# Patient Record
Sex: Female | Born: 1935 | Race: White | Hispanic: No | Smoking: Former smoker
Health system: Southern US, Community
[De-identification: ages and names within clinical notes are randomized; demographics above are authoritative.]

## PROBLEM LIST (undated history)

## (undated) DIAGNOSIS — J449 Chronic obstructive pulmonary disease, unspecified: Secondary | ICD-10-CM

## (undated) DIAGNOSIS — M1990 Unspecified osteoarthritis, unspecified site: Secondary | ICD-10-CM

## (undated) DIAGNOSIS — Z8619 Personal history of other infectious and parasitic diseases: Secondary | ICD-10-CM

## (undated) DIAGNOSIS — C069 Malignant neoplasm of mouth, unspecified: Secondary | ICD-10-CM

## (undated) DIAGNOSIS — D649 Anemia, unspecified: Secondary | ICD-10-CM

## (undated) DIAGNOSIS — G459 Transient cerebral ischemic attack, unspecified: Secondary | ICD-10-CM

## (undated) DIAGNOSIS — I639 Cerebral infarction, unspecified: Secondary | ICD-10-CM

## (undated) DIAGNOSIS — I10 Essential (primary) hypertension: Secondary | ICD-10-CM

## (undated) DIAGNOSIS — G5600 Carpal tunnel syndrome, unspecified upper limb: Secondary | ICD-10-CM

## (undated) DIAGNOSIS — E119 Type 2 diabetes mellitus without complications: Secondary | ICD-10-CM

## (undated) DIAGNOSIS — C3490 Malignant neoplasm of unspecified part of unspecified bronchus or lung: Secondary | ICD-10-CM

## (undated) DIAGNOSIS — J45909 Unspecified asthma, uncomplicated: Secondary | ICD-10-CM

## (undated) DIAGNOSIS — H349 Unspecified retinal vascular occlusion: Secondary | ICD-10-CM

## (undated) DIAGNOSIS — I2510 Atherosclerotic heart disease of native coronary artery without angina pectoris: Secondary | ICD-10-CM

## (undated) DIAGNOSIS — I509 Heart failure, unspecified: Secondary | ICD-10-CM

## (undated) HISTORY — PX: CATARACT EXTRACTION: SUR2

## (undated) HISTORY — PX: ABDOMINAL HYSTERECTOMY: SHX81

## (undated) HISTORY — PX: MOUTH SURGERY: SHX715

## (undated) HISTORY — PX: CHOLECYSTECTOMY: SHX55

## (undated) HISTORY — PX: APPENDECTOMY: SHX54

---
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
## 2005-01-20 ENCOUNTER — Ambulatory Visit: Payer: Self-pay | Admitting: Unknown Physician Specialty

## 2005-02-02 ENCOUNTER — Emergency Department: Payer: Self-pay | Admitting: General Practice

## 2005-05-18 ENCOUNTER — Ambulatory Visit: Payer: Self-pay

## 2005-06-29 ENCOUNTER — Ambulatory Visit: Payer: Self-pay | Admitting: Family Medicine

## 2005-07-27 ENCOUNTER — Ambulatory Visit: Payer: Self-pay | Admitting: Rheumatology

## 2005-08-02 ENCOUNTER — Emergency Department: Payer: Self-pay | Admitting: Emergency Medicine

## 2005-08-02 ENCOUNTER — Other Ambulatory Visit: Payer: Self-pay

## 2005-08-15 ENCOUNTER — Encounter: Payer: Self-pay | Admitting: Rheumatology

## 2005-09-04 ENCOUNTER — Encounter: Payer: Self-pay | Admitting: Rheumatology

## 2005-10-05 ENCOUNTER — Encounter: Payer: Self-pay | Admitting: Rheumatology

## 2006-07-12 ENCOUNTER — Other Ambulatory Visit: Payer: Self-pay

## 2006-07-12 ENCOUNTER — Ambulatory Visit: Payer: Self-pay | Admitting: Ophthalmology

## 2006-07-17 ENCOUNTER — Ambulatory Visit: Payer: Self-pay | Admitting: Ophthalmology

## 2006-07-21 ENCOUNTER — Ambulatory Visit: Payer: Self-pay | Admitting: Unknown Physician Specialty

## 2006-09-05 ENCOUNTER — Ambulatory Visit: Payer: Self-pay | Admitting: Ophthalmology

## 2006-09-11 ENCOUNTER — Ambulatory Visit: Payer: Self-pay | Admitting: Ophthalmology

## 2007-05-09 ENCOUNTER — Ambulatory Visit: Payer: Self-pay | Admitting: Unknown Physician Specialty

## 2007-05-14 ENCOUNTER — Ambulatory Visit: Payer: Self-pay | Admitting: Unknown Physician Specialty

## 2007-08-28 ENCOUNTER — Ambulatory Visit: Payer: Self-pay | Admitting: Urology

## 2007-10-11 ENCOUNTER — Ambulatory Visit: Payer: Self-pay | Admitting: Unknown Physician Specialty

## 2007-11-20 ENCOUNTER — Ambulatory Visit: Payer: Self-pay | Admitting: Family Medicine

## 2008-05-13 ENCOUNTER — Ambulatory Visit: Payer: Self-pay | Admitting: Urology

## 2008-12-05 ENCOUNTER — Ambulatory Visit: Payer: Self-pay | Admitting: Oncology

## 2008-12-05 HISTORY — PX: LOBECTOMY: SHX5089

## 2008-12-10 ENCOUNTER — Ambulatory Visit: Payer: Self-pay | Admitting: Unknown Physician Specialty

## 2008-12-15 ENCOUNTER — Ambulatory Visit: Payer: Self-pay | Admitting: Specialist

## 2008-12-22 ENCOUNTER — Ambulatory Visit: Payer: Self-pay | Admitting: Specialist

## 2008-12-24 ENCOUNTER — Ambulatory Visit: Payer: Self-pay | Admitting: Oncology

## 2009-01-05 ENCOUNTER — Ambulatory Visit: Payer: Self-pay | Admitting: Oncology

## 2009-01-06 ENCOUNTER — Ambulatory Visit: Payer: Self-pay | Admitting: Surgery

## 2009-01-14 ENCOUNTER — Inpatient Hospital Stay: Payer: Self-pay | Admitting: Surgery

## 2009-01-23 ENCOUNTER — Encounter: Payer: Self-pay | Admitting: Internal Medicine

## 2009-01-31 ENCOUNTER — Inpatient Hospital Stay: Payer: Self-pay | Admitting: Surgery

## 2009-02-02 ENCOUNTER — Ambulatory Visit: Payer: Self-pay | Admitting: Oncology

## 2009-02-07 ENCOUNTER — Encounter: Payer: Self-pay | Admitting: Internal Medicine

## 2009-02-16 ENCOUNTER — Ambulatory Visit: Payer: Self-pay | Admitting: Surgery

## 2009-02-23 ENCOUNTER — Ambulatory Visit: Payer: Self-pay | Admitting: Oncology

## 2009-02-25 ENCOUNTER — Emergency Department: Payer: Self-pay | Admitting: Emergency Medicine

## 2009-03-05 ENCOUNTER — Ambulatory Visit: Payer: Self-pay | Admitting: Oncology

## 2009-03-11 ENCOUNTER — Ambulatory Visit: Payer: Self-pay | Admitting: Surgery

## 2009-03-12 IMAGING — CR DG CHEST 2V
1 series · 2 of 2 positions shown · non-contrast
Comparison: none

REASON FOR EXAM: f/u thoracotomy
COMMENTS:

[Series 1: view not recorded · 0.17mm/px · 2 of 2 slices shown]
[im 1/2]
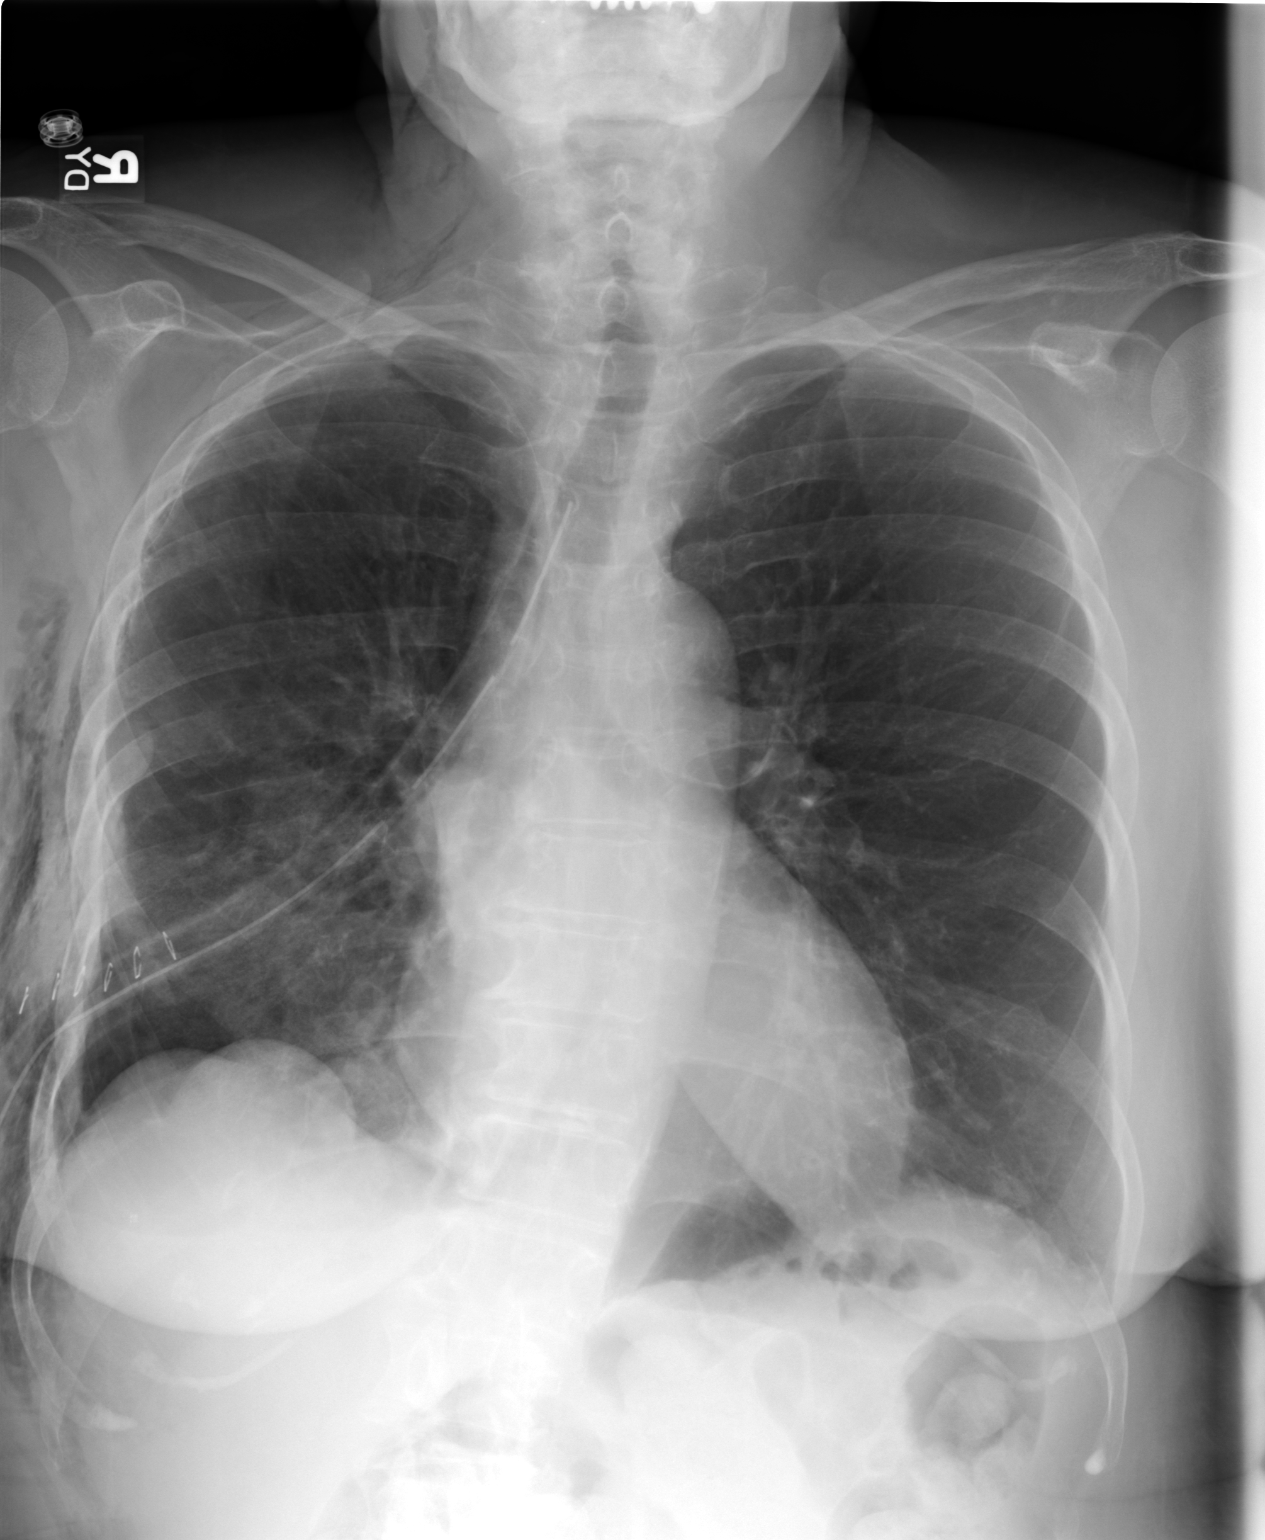
[im 2/2]
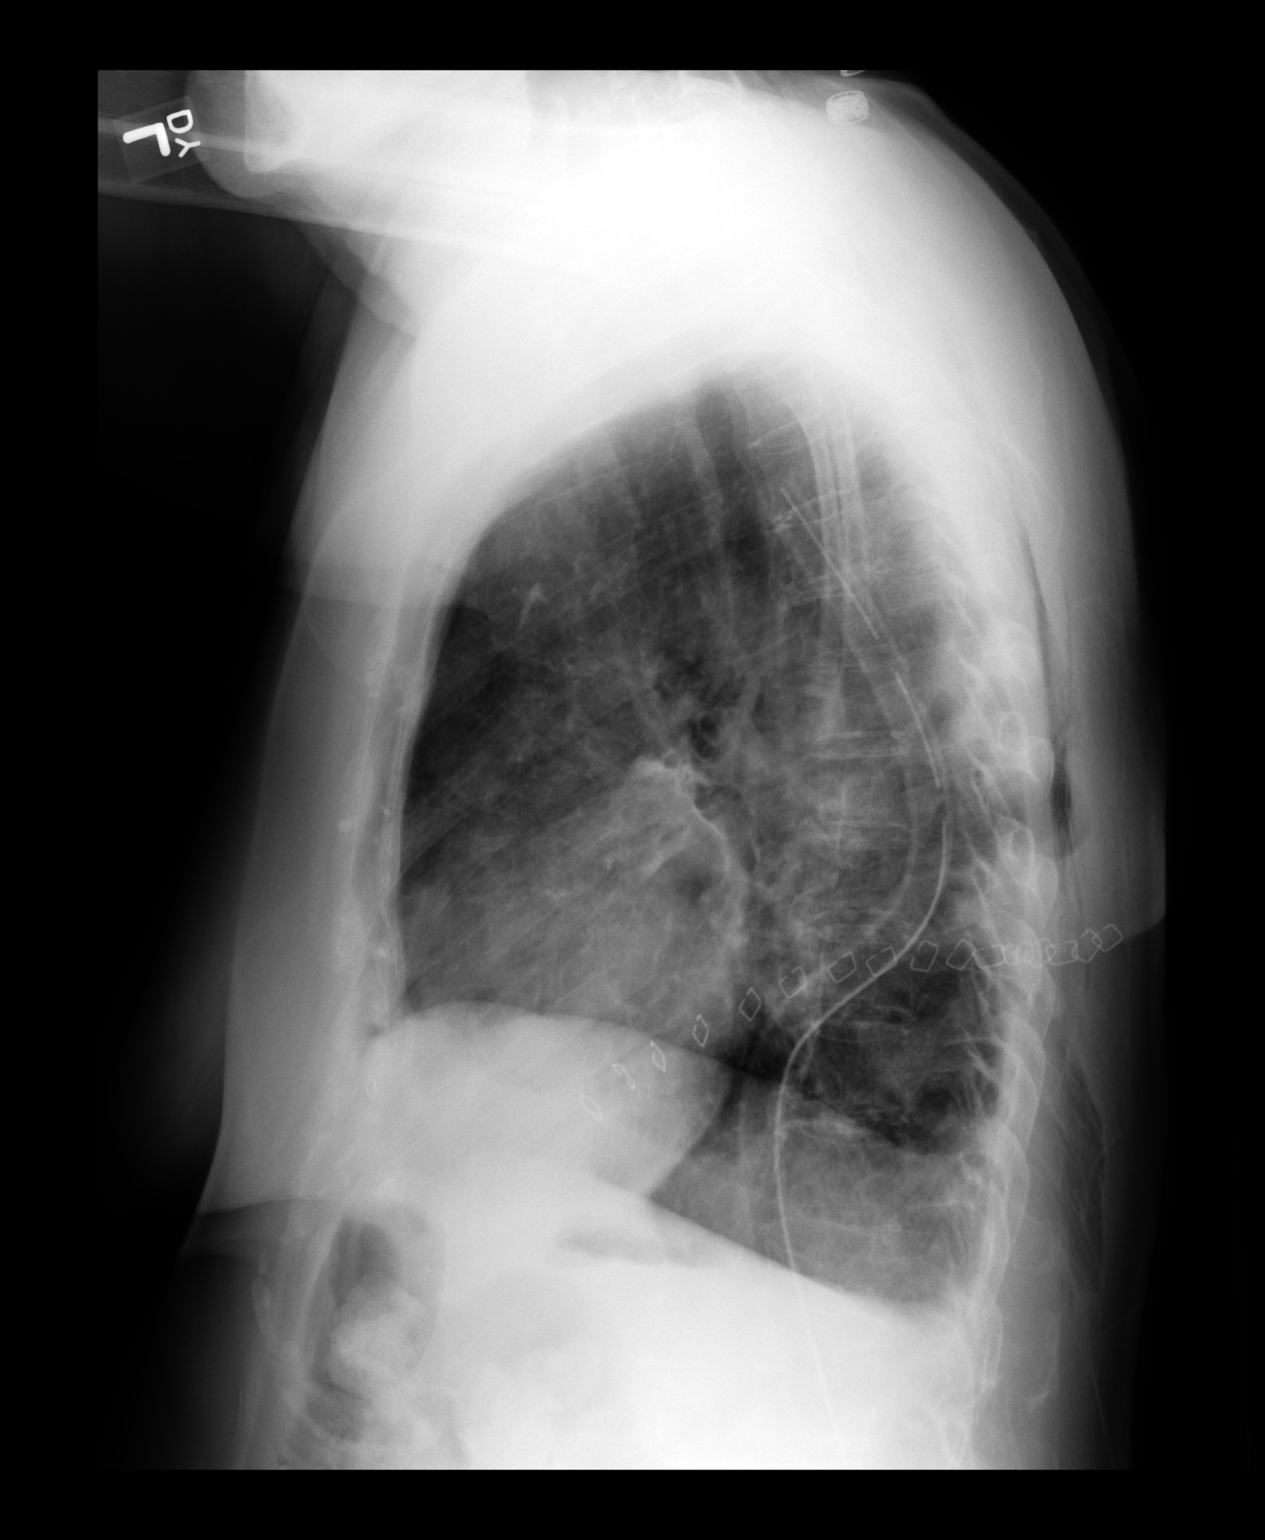

[2 of 2 positions shown; findings below may reference images not displayed]

PROCEDURE:     DXR - DXR CHEST PA (OR AP) AND LATERAL  - January 19, 2009 [DATE]

RESULT:     Comparison is made to the exam of 01/17/2009.

Right sided chest tube remains in place with the tip along the upper
mediastinal region. There is a small apical pneumothorax on the right which
is unchanged. There is a moderately large amount of subcutaneous emphysema.
There is lucency at the right costophrenic angle which could represent a
small pneumothorax in that region. The heart is not enlarged. The lungs are
otherwise relatively clear with improved aeration in the right lung base.
IMPRESSION: Single chest tube remains. Persistent pneumothorax.
Recurrent subcutaneous emphysema.

## 2009-03-14 IMAGING — CR DG CHEST 1V PORT
1 series · 1 of 1 positions shown · non-contrast
Comparison: none

REASON FOR EXAM: Thoracotomy
COMMENTS:

PROCEDURE:     DXR - DXR PORTABLE CHEST SINGLE VIEW  - January 21, 2009  [DATE]
RESULT:     Comparison: 01/19/2009

[view not recorded]
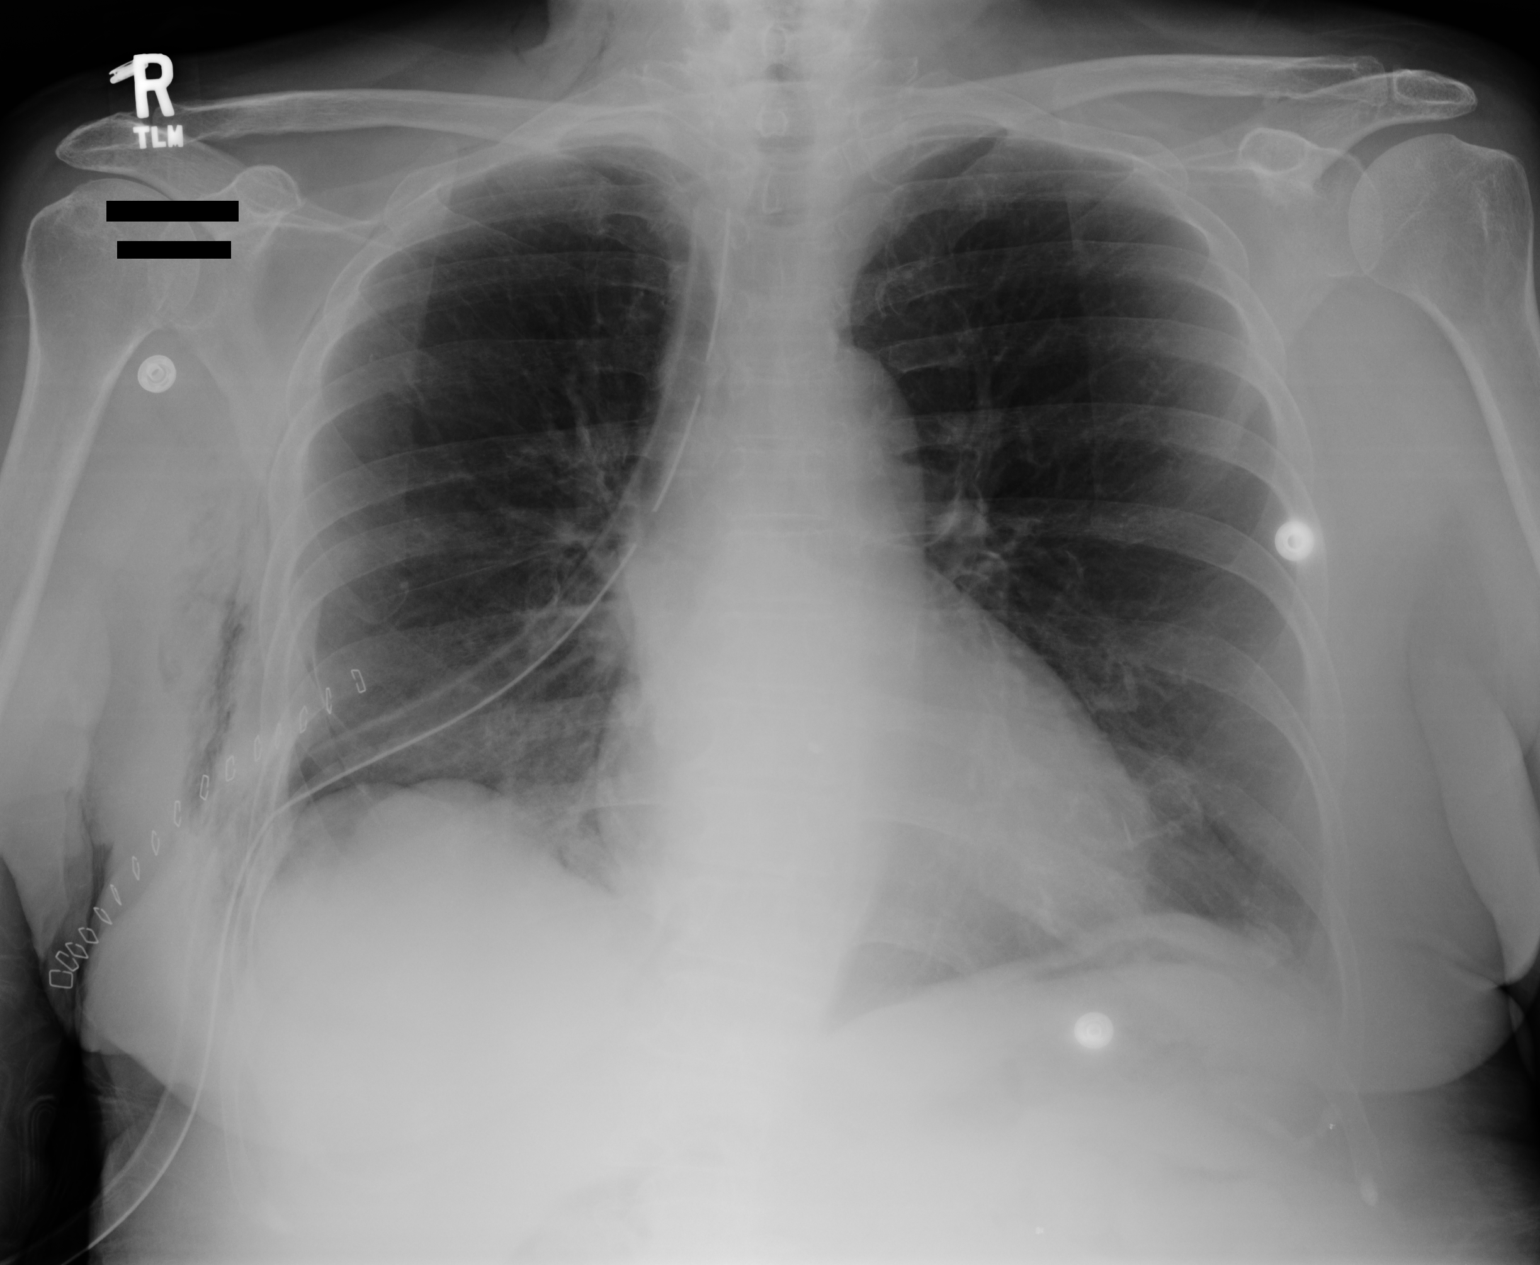

[1 of 1 positions shown; findings below may reference images not displayed]

FINDINGS: Single portable AP chest radiograph is provided. Again demonstrated is a
right-sided chest tube with the tip in unchanged position. There is no
significant pneumothorax visualized. There is subcutaneous emphysema in the
right lateral chest wall which is decreased from the prior examination.
There is lucency at the right costophrenic angle which may represent a small
pneumothorax versus superimposed cutaneous emphysema. The heart and
mediastinum are stable.
IMPRESSION: Please see above.

## 2009-03-15 IMAGING — CR DG CHEST 1V PORT
1 series · 1 of 1 positions shown · non-contrast
Comparison: none

REASON FOR EXAM: Thoracotomy
COMMENTS:

[view not recorded]
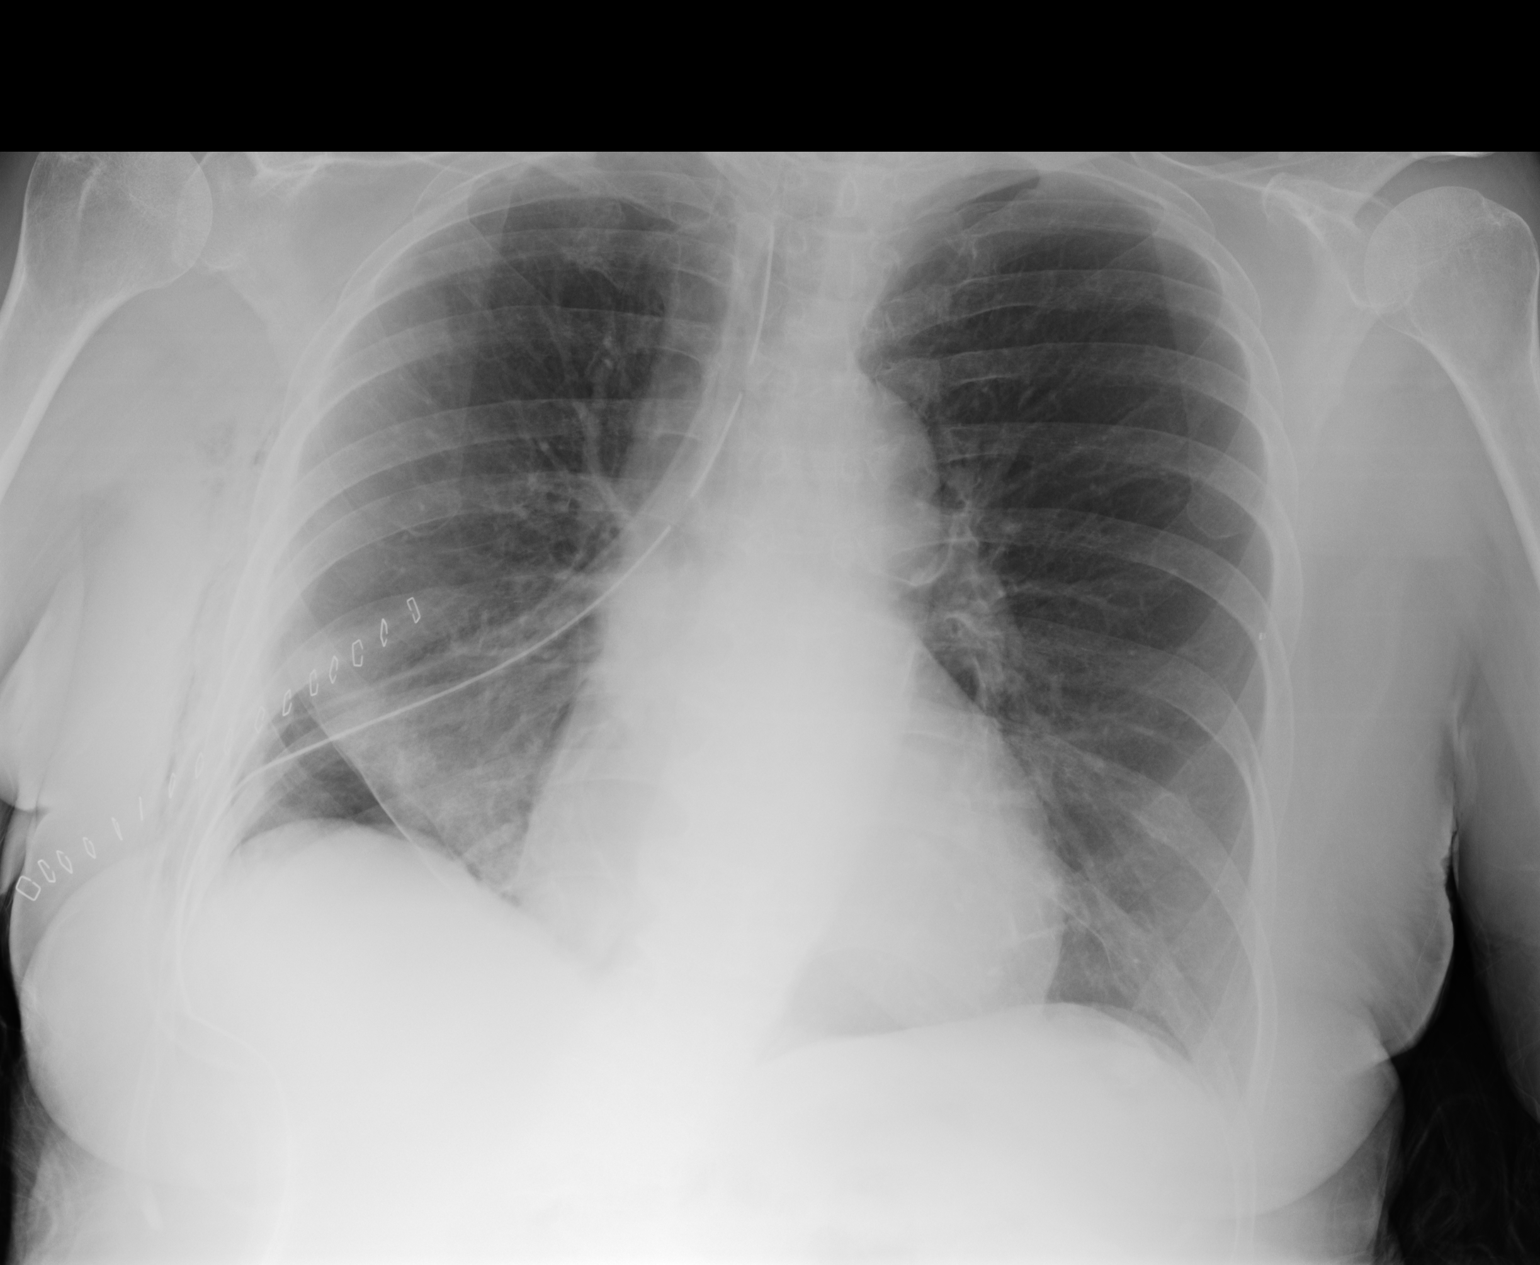

[1 of 1 positions shown; findings below may reference images not displayed]

PROCEDURE:     DXR - DXR PORTABLE CHEST SINGLE VIEW  - January 22, 2009  [DATE]

RESULT:     Thoracotomy is noted on the right. A chest tube is noted on the
right. A small, right pneumothorax is present. Subcutaneous emphysema is
noted in the right chest. The left lung is clear. The cardiovascular
structures are unremarkable.
IMPRESSION: Chest tube noted in stable position with a small, right base
pneumothorax that is stable. The patient has had a right thoracotomy. No
acute changes are noted from 01/21/2009.

## 2009-03-24 IMAGING — CR DG CHEST 2V
1 series · 2 of 2 positions shown · non-contrast
Comparison: none

REASON FOR EXAM: pneumothorax
COMMENTS:

[Series 1: view not recorded · 0.17mm/px · 2 of 2 slices shown]
[im 1/2]
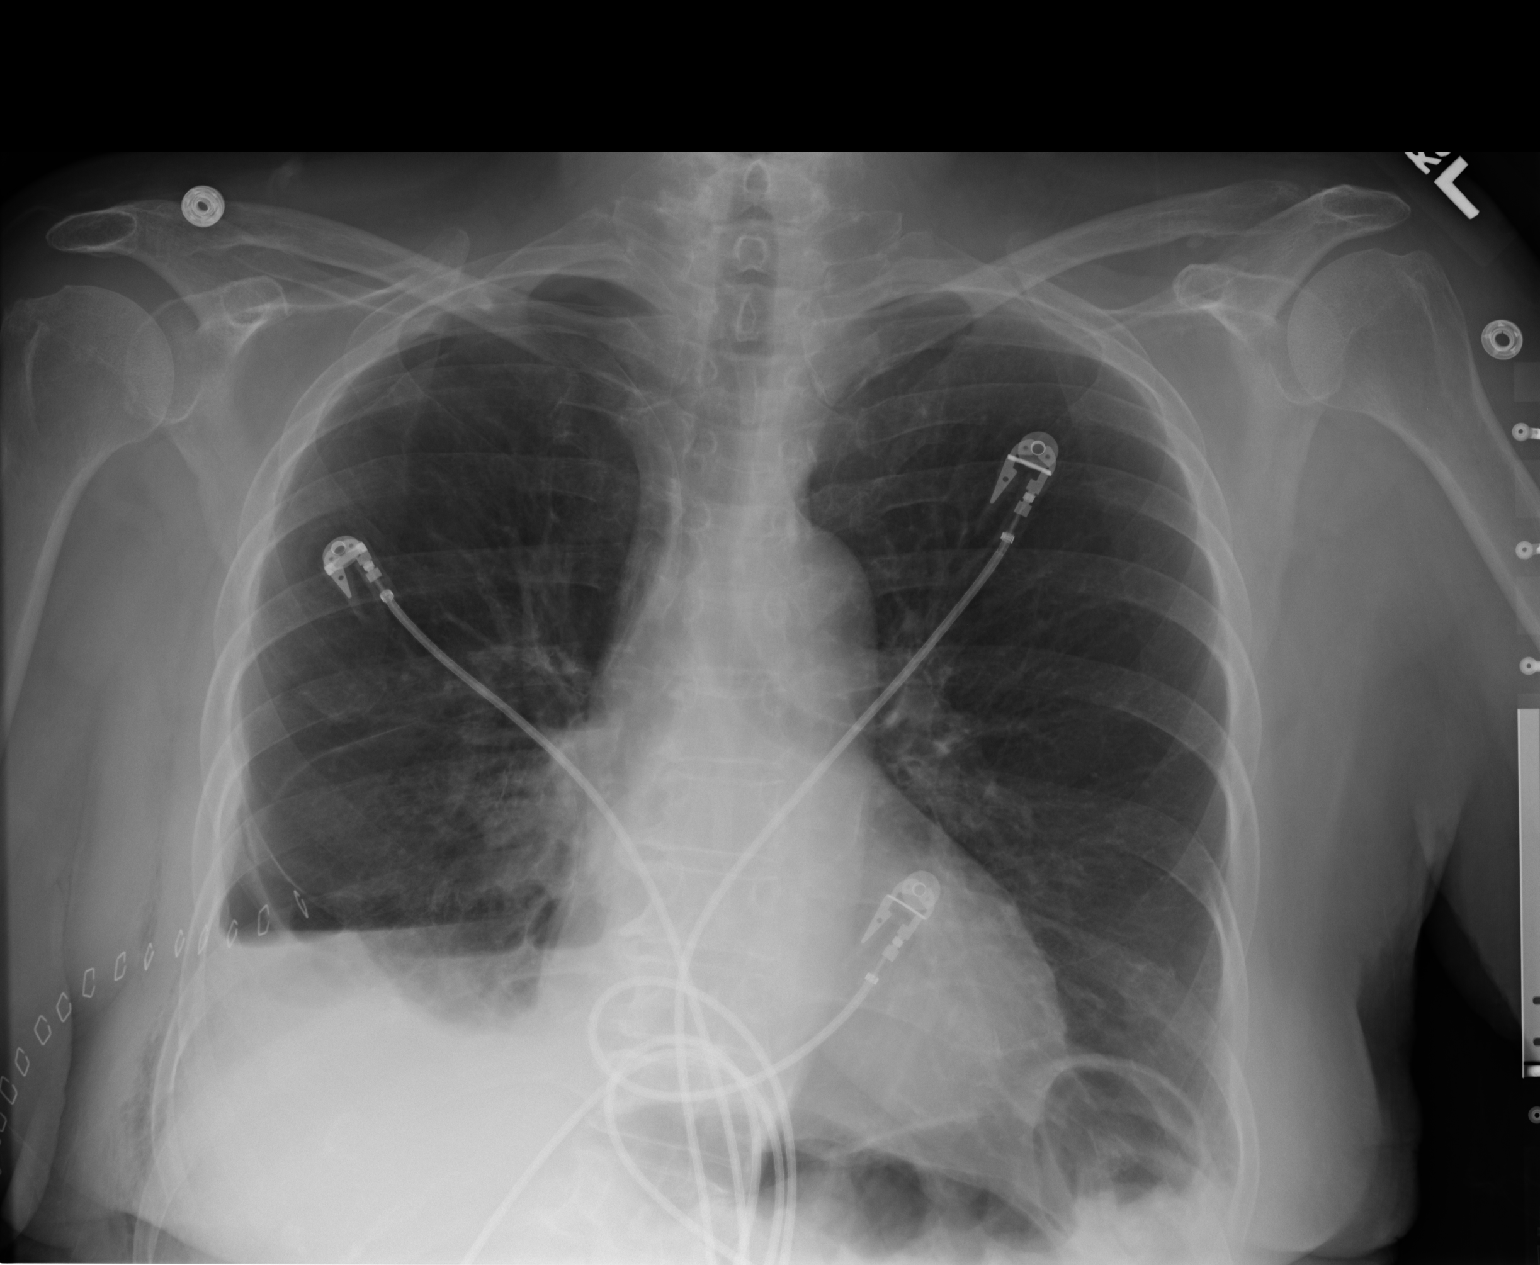
[im 2/2]
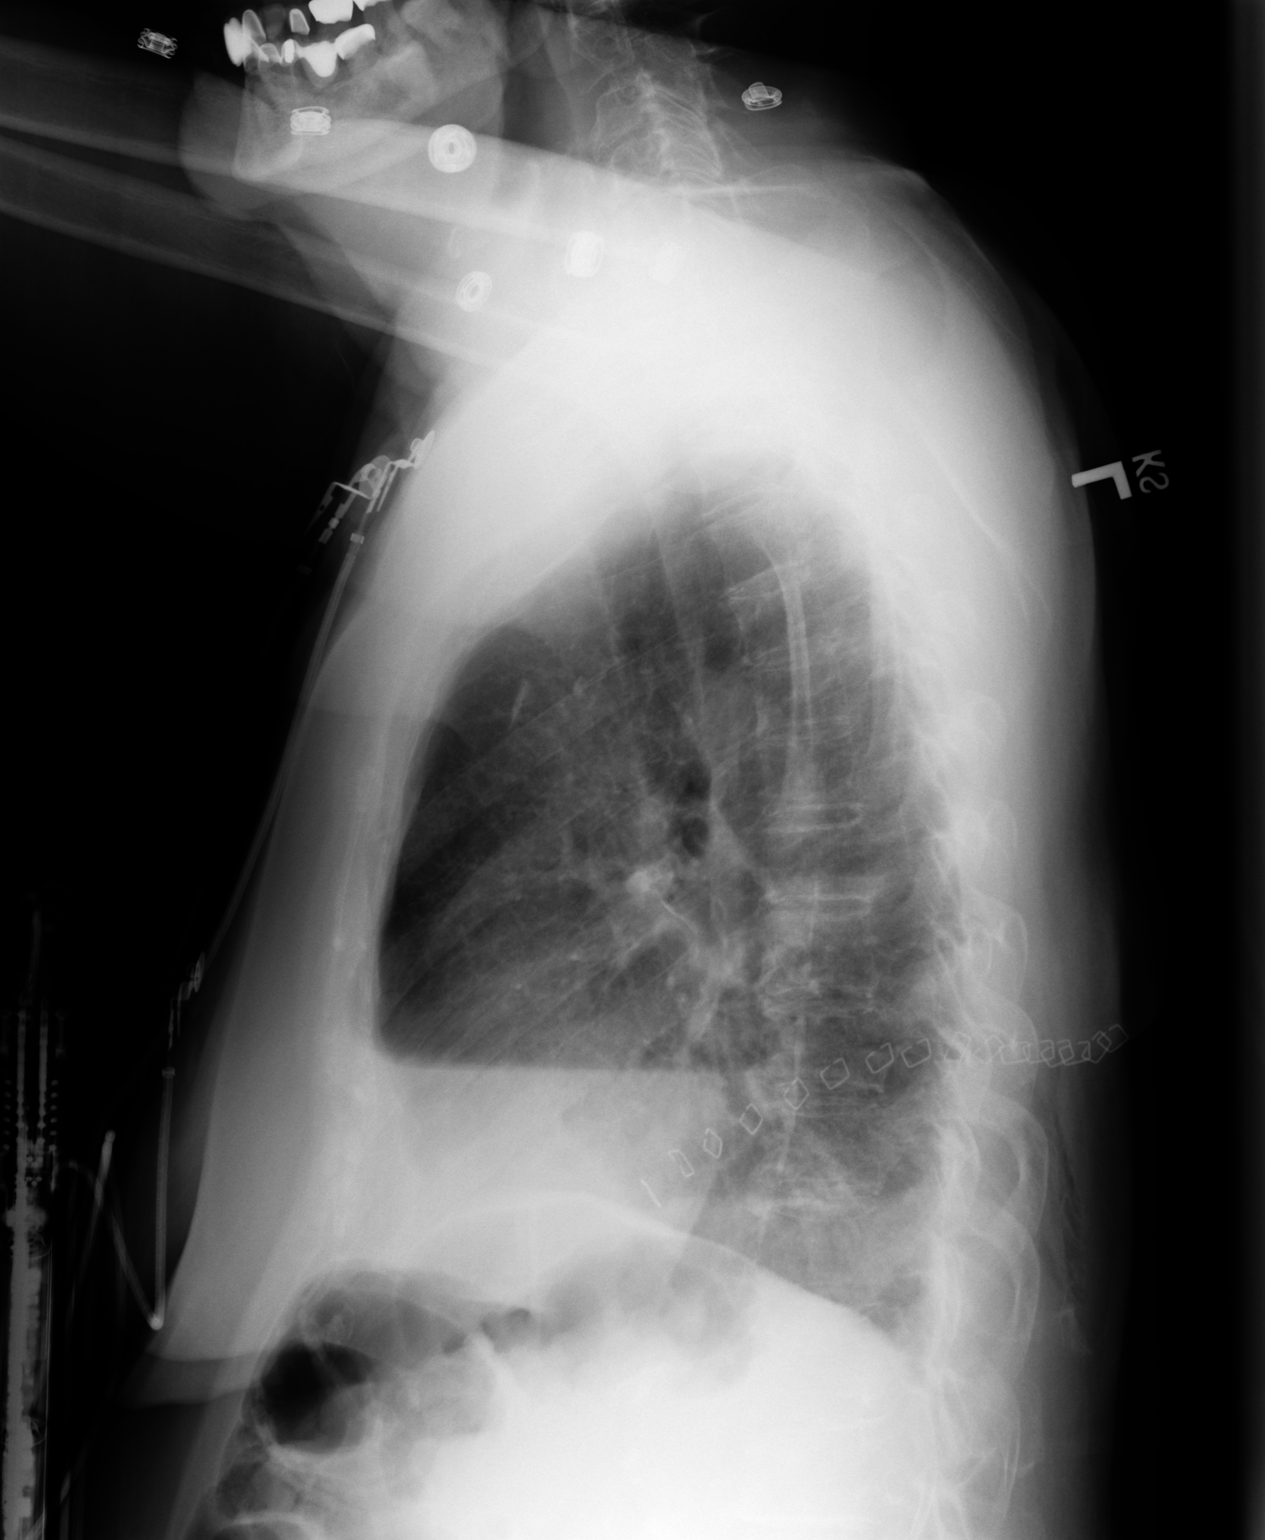

[2 of 2 positions shown; findings below may reference images not displayed]

PROCEDURE:     DXR - DXR CHEST PA (OR AP) AND LATERAL  - January 31, 2009  [DATE]

RESULT:     Comparison is made to study 30 January, 2009.

A hydropneumothorax on the right is present. The pleural line is visible
along the pulmonary apex. There may be slightly more air today than on the
prior study. Surgical skin staples are present at the right lung base. There
is there are air-fluid levels at the right lung base. The left lung is
well-expanded and clear and there is no mediastinal shift. The heart is not
enlarged.
IMPRESSION: There is a hydropneumothorax on the right. This has
increased in conspicuity slightly since the prior study. A portion of this
is likely related to a more upright position. I do not see evidence of
mediastinal shift currently.

Findings are known to Dr. Patrick Junior.

## 2009-03-25 ENCOUNTER — Inpatient Hospital Stay: Payer: Self-pay | Admitting: Internal Medicine

## 2009-03-30 IMAGING — CR DG CHEST 2V
1 series · 2 of 2 positions shown · non-contrast
Comparison: none

REASON FOR EXAM: pleural effusion
COMMENTS:

[Series 1: view not recorded · 0.17mm/px · 2 of 2 slices shown]
[im 1/2]
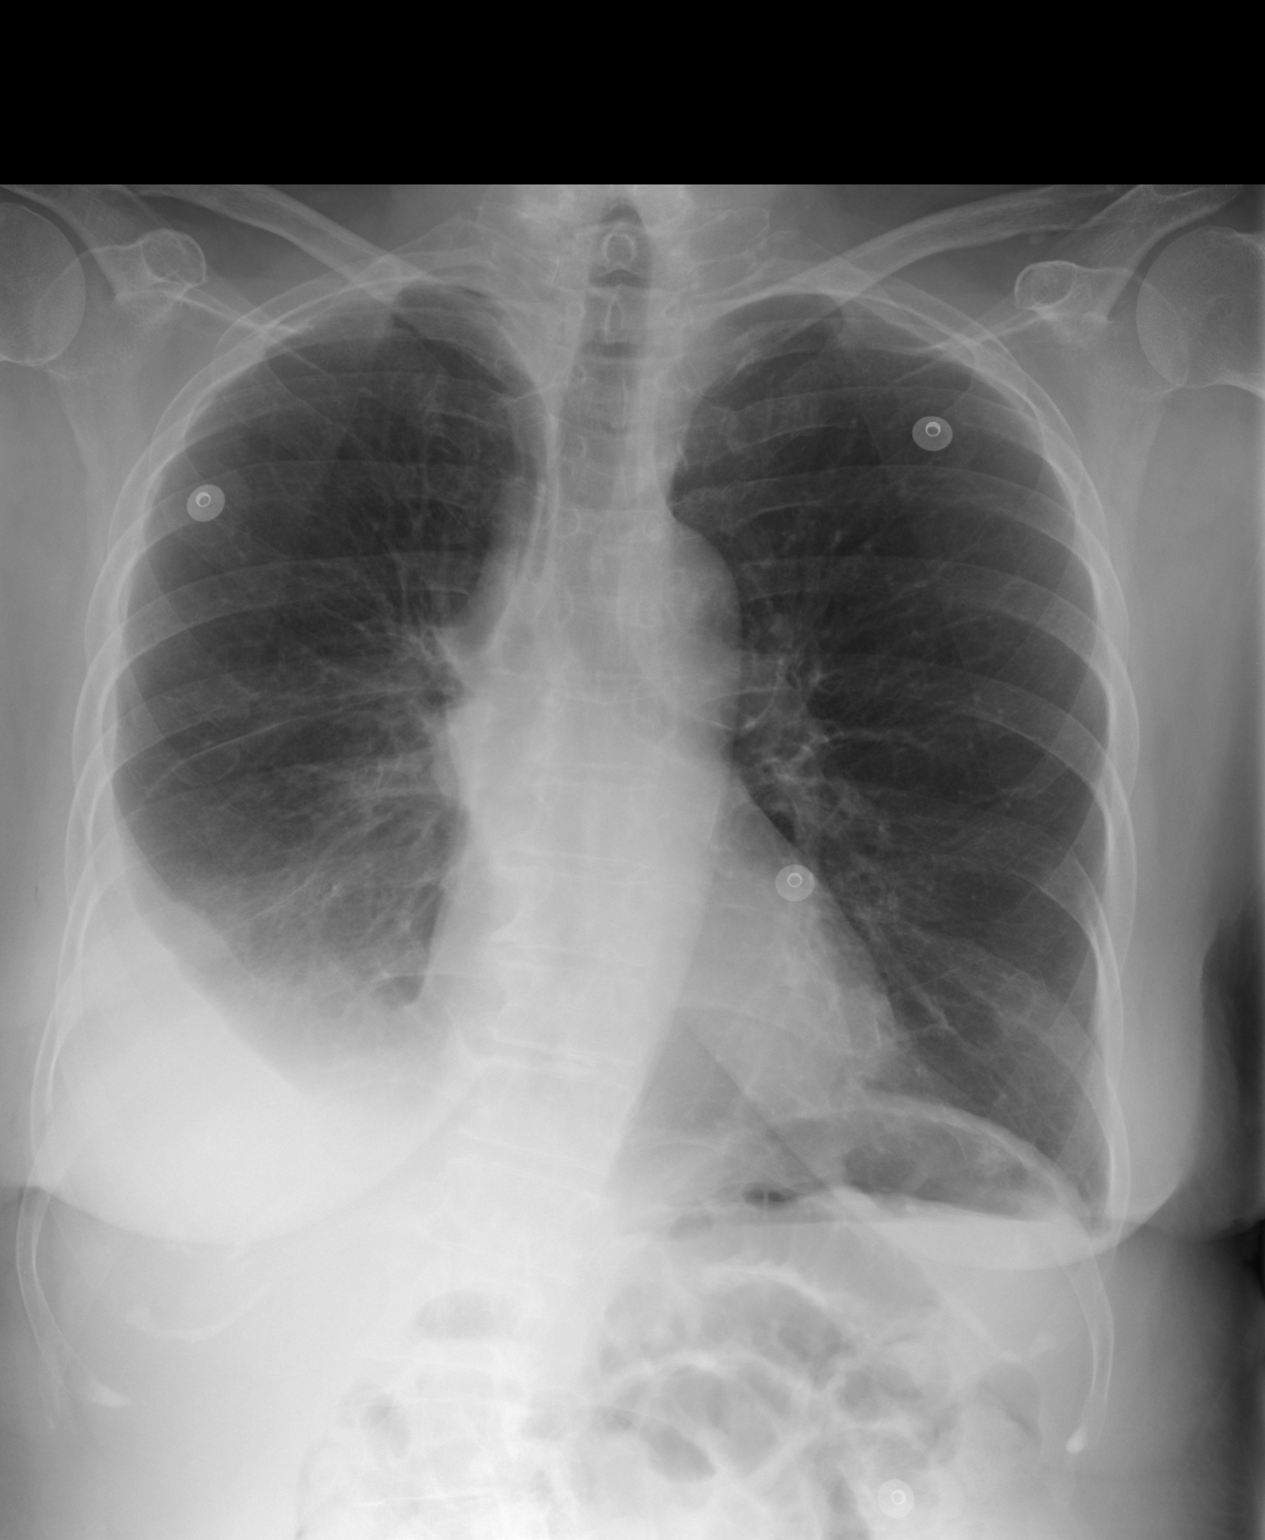
[im 2/2]
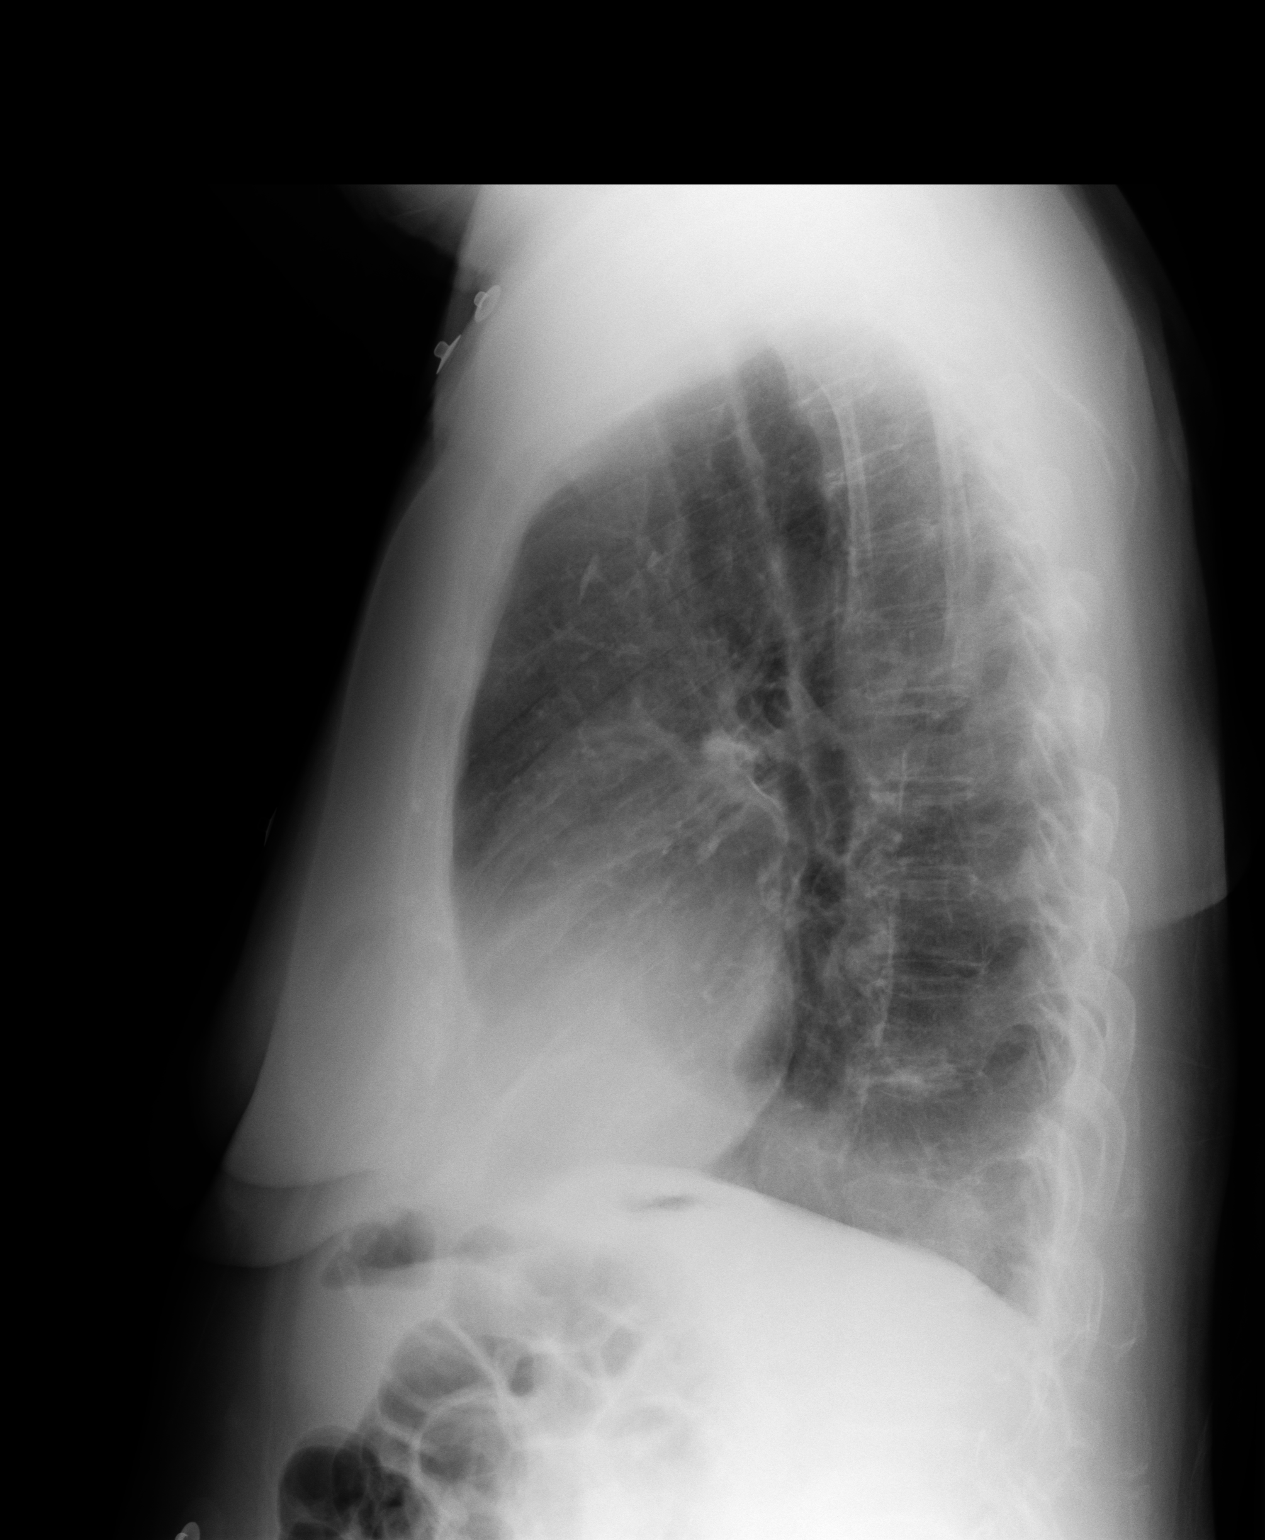

[2 of 2 positions shown; findings below may reference images not displayed]

PROCEDURE:     DXR - DXR CHEST PA (OR AP) AND LATERAL  - February 06, 2009 [DATE]

RESULT:     Comparison is made to the prior exam of 02/05/2009.

There is again noted increased density at the right base compatible with a
right pleural effusion that appears stable as compared to the prior exam.
The left lung field is clear. The heart size is normal.
IMPRESSION: Right pleural effusion, stable as compared to the exam of
02/05/2009.

## 2009-05-05 ENCOUNTER — Ambulatory Visit: Payer: Self-pay | Admitting: Oncology

## 2009-05-25 ENCOUNTER — Ambulatory Visit: Payer: Self-pay | Admitting: Oncology

## 2009-06-04 ENCOUNTER — Ambulatory Visit: Payer: Self-pay | Admitting: Oncology

## 2009-08-05 ENCOUNTER — Ambulatory Visit: Payer: Self-pay | Admitting: Oncology

## 2009-08-11 ENCOUNTER — Ambulatory Visit: Payer: Self-pay | Admitting: Oncology

## 2009-09-04 ENCOUNTER — Ambulatory Visit: Payer: Self-pay | Admitting: Oncology

## 2009-09-23 ENCOUNTER — Inpatient Hospital Stay: Payer: Self-pay | Admitting: Orthopedic Surgery

## 2009-09-28 ENCOUNTER — Encounter: Payer: Self-pay | Admitting: Internal Medicine

## 2009-10-05 ENCOUNTER — Encounter: Payer: Self-pay | Admitting: Internal Medicine

## 2009-12-05 ENCOUNTER — Ambulatory Visit: Payer: Self-pay | Admitting: Oncology

## 2009-12-17 ENCOUNTER — Ambulatory Visit: Payer: Self-pay | Admitting: Oncology

## 2010-01-05 ENCOUNTER — Ambulatory Visit: Payer: Self-pay | Admitting: Oncology

## 2010-01-27 ENCOUNTER — Ambulatory Visit: Payer: Self-pay | Admitting: Gastroenterology

## 2010-03-05 ENCOUNTER — Ambulatory Visit: Payer: Self-pay | Admitting: Oncology

## 2010-03-08 ENCOUNTER — Ambulatory Visit: Payer: Self-pay | Admitting: Oncology

## 2010-03-11 ENCOUNTER — Ambulatory Visit: Payer: Self-pay | Admitting: Oncology

## 2010-04-04 ENCOUNTER — Ambulatory Visit: Payer: Self-pay | Admitting: Oncology

## 2010-09-04 ENCOUNTER — Ambulatory Visit: Payer: Self-pay | Admitting: Oncology

## 2010-09-10 ENCOUNTER — Ambulatory Visit: Payer: Self-pay | Admitting: Oncology

## 2010-09-15 ENCOUNTER — Ambulatory Visit: Payer: Self-pay | Admitting: Oncology

## 2010-10-05 ENCOUNTER — Ambulatory Visit: Payer: Self-pay | Admitting: Oncology

## 2011-02-11 ENCOUNTER — Ambulatory Visit: Payer: Self-pay | Admitting: Unknown Physician Specialty

## 2011-03-15 ENCOUNTER — Ambulatory Visit: Payer: Self-pay | Admitting: Oncology

## 2011-03-17 ENCOUNTER — Ambulatory Visit: Payer: Self-pay | Admitting: Oncology

## 2011-04-05 ENCOUNTER — Ambulatory Visit: Payer: Self-pay | Admitting: Oncology

## 2012-07-27 ENCOUNTER — Ambulatory Visit: Payer: Self-pay | Admitting: Unknown Physician Specialty

## 2013-01-16 DIAGNOSIS — N3946 Mixed incontinence: Secondary | ICD-10-CM | POA: Insufficient documentation

## 2013-01-16 DIAGNOSIS — R350 Frequency of micturition: Secondary | ICD-10-CM | POA: Insufficient documentation

## 2013-01-16 DIAGNOSIS — R351 Nocturia: Secondary | ICD-10-CM | POA: Insufficient documentation

## 2013-01-16 DIAGNOSIS — N3020 Other chronic cystitis without hematuria: Secondary | ICD-10-CM | POA: Insufficient documentation

## 2014-07-28 DIAGNOSIS — E785 Hyperlipidemia, unspecified: Secondary | ICD-10-CM | POA: Insufficient documentation

## 2014-07-28 DIAGNOSIS — M1990 Unspecified osteoarthritis, unspecified site: Secondary | ICD-10-CM | POA: Insufficient documentation

## 2014-07-28 DIAGNOSIS — C3490 Malignant neoplasm of unspecified part of unspecified bronchus or lung: Secondary | ICD-10-CM | POA: Insufficient documentation

## 2014-07-28 DIAGNOSIS — I2510 Atherosclerotic heart disease of native coronary artery without angina pectoris: Secondary | ICD-10-CM | POA: Insufficient documentation

## 2014-07-28 DIAGNOSIS — J45909 Unspecified asthma, uncomplicated: Secondary | ICD-10-CM | POA: Insufficient documentation

## 2014-07-28 DIAGNOSIS — I10 Essential (primary) hypertension: Secondary | ICD-10-CM | POA: Insufficient documentation

## 2014-07-28 DIAGNOSIS — I509 Heart failure, unspecified: Secondary | ICD-10-CM | POA: Insufficient documentation

## 2014-07-28 DIAGNOSIS — E119 Type 2 diabetes mellitus without complications: Secondary | ICD-10-CM | POA: Insufficient documentation

## 2014-07-28 DIAGNOSIS — C069 Malignant neoplasm of mouth, unspecified: Secondary | ICD-10-CM | POA: Insufficient documentation

## 2014-07-28 DIAGNOSIS — H349 Unspecified retinal vascular occlusion: Secondary | ICD-10-CM | POA: Insufficient documentation

## 2015-02-16 ENCOUNTER — Inpatient Hospital Stay: Payer: Self-pay | Admitting: Internal Medicine

## 2015-03-04 ENCOUNTER — Inpatient Hospital Stay
Admit: 2015-03-04 | Disposition: A | Discharge status: Home / Self Care | Payer: Self-pay | Attending: Internal Medicine | Admitting: Internal Medicine

## 2015-03-04 LAB — COMPREHENSIVE METABOLIC PANEL
Albumin: 3.2 g/dL — ABNORMAL LOW
Alkaline Phosphatase: 55 U/L
Anion Gap: 9 (ref 7–16)
BUN: 24 mg/dL — ABNORMAL HIGH
Bilirubin,Total: 0.4 mg/dL
CALCIUM: 8.6 mg/dL — AB
CO2: 28 mmol/L
Chloride: 96 mmol/L — ABNORMAL LOW
Creatinine: 0.99 mg/dL
EGFR (African American): 58 — ABNORMAL LOW
GFR CALC NON AF AMER: 50 — AB
GLUCOSE: 131 mg/dL — AB
Potassium: 4.7 mmol/L
SGOT(AST): 28 U/L
SGPT (ALT): 18 U/L
Sodium: 133 mmol/L — ABNORMAL LOW
Total Protein: 7.1 g/dL

## 2015-03-04 LAB — CBC
HCT: 34.3 % — AB (ref 35.0–47.0)
HGB: 11.5 g/dL — ABNORMAL LOW (ref 12.0–16.0)
MCH: 35 pg — ABNORMAL HIGH (ref 26.0–34.0)
MCHC: 33.5 g/dL (ref 32.0–36.0)
MCV: 105 fL — AB (ref 80–100)
Platelet: 227 10*3/uL (ref 150–440)
RBC: 3.28 10*6/uL — ABNORMAL LOW (ref 3.80–5.20)
RDW: 12.2 % (ref 11.5–14.5)
WBC: 7.9 10*3/uL (ref 3.6–11.0)

## 2015-03-04 LAB — HEMOGLOBIN
HGB: 10.2 g/dL — ABNORMAL LOW (ref 12.0–16.0)
HGB: 9.2 g/dL — AB (ref 12.0–16.0)

## 2015-03-04 LAB — TROPONIN I: Troponin-I: <0.03 ng/mL

## 2015-03-05 LAB — HEMOGLOBIN: HGB: 9.2 g/dL — ABNORMAL LOW (ref 12.0–16.0)

## 2015-03-05 LAB — TSH: Thyroid Stimulating Horm: 2.448 u[IU]/mL

## 2015-03-06 LAB — HEMOGLOBIN: HGB: 9.2 g/dL — ABNORMAL LOW (ref 12.0–16.0)

## 2015-04-05 NOTE — H&P (Signed)
PATIENT NAME:  Joann Wilkinson, Joann Wilkinson MR#:  [id number] DATE OF BIRTH:  1922-11-23  DATE OF ADMISSION:  03/04/2015  REFERRING PHYSICIAN: Loney Hering, M.D.  PRIMARY CARE PHYSICIAN: Susa Loffler, PA-C, with [facility]. ADMISSION DIAGNOSIS: Lower gastrointestinal bleed.   HISTORY OF PRESENT ILLNESS: This is a 81 year old, Caucasian female, who presents to the Emergency Department complaining of a large bloody stool at home. The patient states that the bowel movement was painless. She denies any lightheadedness, chest pain, nausea or vomiting. Upon arrival to the Emergency Department, she had another bloody stool, followed by a bowel movement in which she passed bright red clots of blood. She states this has happened before when she was on Plavix 6 years ago. She has been recovering from pneumonia following discharge from this hospital a few weeks ago, but has not had any gastrointestinal symptoms until today. Due to her presenting symptoms and need for gastroenterology evaluation, the Emergency Department staff called for admission.   REVIEW OF SYSTEMS:    CONSTITUTIONAL: The patient denies fever, but admits to weakness.  EYES: Denies blurred vision or inflammation.  ENT: Denies tinnitus or sore throat.  RESPIRATORY: Denies shortness of breath or cough.  CARDIOVASCULAR: Denies chest pain, palpitations, orthopnea, or paroxysmal nocturnal dyspnea.  GASTROINTESTINAL: Denies nausea, vomiting, or abdominal pain.  GENITOURINARY: Denies dysuria, increased frequency, or hesitancy of urination.  ENDOCRINE: Denies polyuria or polydipsia.  HEMATOLOGIC AND LYMPHATIC: Denies bleeding, but admits to easy bruising.  INTEGUMENTARY: Denies rashes or lesions.  MUSCULOSKELETAL: Denies arthralgias or myalgias.  NEUROLOGIC: Denies numbness in her extremities or dysarthria.  PSYCHIATRIC: Denies depression or suicidal ideation.   PAST MEDICAL HISTORY: Diverticulosis, hypertension, diabetes type 2, COPD,  cerebrovascular occlusion of the retinal artery, carpal tunnel syndrome, history of GI bleed, history of lung cancer, and history of mouth cancer.   PAST SURGICAL HISTORY: Right lower lobectomy, elbow reconstruction, salivary gland removal, open reduction and internal fixation of left femur, hemorrhoidectomy, cataract removal bilaterally, and hysterectomy.   SOCIAL HISTORY: The patient lives alone. She does not smoke, drink, or do any drugs now; however, she is a former smoker. She is able to ambulate with the help of a walker for stability.   FAMILY HISTORY: Significant for coronary artery disease in her parents.   MEDICATIONS:  1. Acetaminophen with diphenhydramine 500 mg/25 mg 2 tablets p.o. at bedtime.  2. Aspirin 81 mg 1 tablet p.o. daily.  3. Celebrex 200 mg 1 to 2 capsules p.o. daily as needed for pain.  4. Tylenol Extra Strength 500 mg 2 tablets p.o. every 6 hours as needed for pain.  5. Glipizide extended release 2.5 mg 1 tablet p.o. daily.  6. Advair 1 puff inhaled 2 times a day.  7. Ventolin 90 mcg/inhalation 2 puffs inhaled 4 times a day as needed for coughing, wheezing, or shortness of breath.  8. Lasix 20 mg 1 tablet p.o. daily.  9. Ferrous sulfate 325 mg 1 tablet p.o. daily.  10. Fluticasone 50 mcg/inhalation 2 sprays nasally once a day.  11. Detrol LA 2 mg extended release 1 capsule p.o. at bedtime.  12. Detrol LA 4 mg extended release 1 capsule p.o. every morning.  13. Multivitamin 1 tablet p.o. daily.  14. Vitamin C 500 mg 1 tablet p.o. daily.   ALLERGIES: CODEINE, ETODOLAC, MORPHINE, PROPOXYPHENE AND TRAMADOL.   PERTINENT LABORATORY RESULTS AND RADIOGRAPHIC FINDINGS: Serum glucose is 131, BUN is 24, creatinine is 0.99, serum sodium is 133, potassium is 4.7, chloride 96, bicarbonate  28, calcium 8.6, alkaline phosphatase 55, AST 28, ALT is 18.   Troponin is negative.   White blood cell count is 7.9, hemoglobin is 11.5, hematocrit is 34.3, platelet count 227,000. MCV is  105.   There is no imaging.   PHYSICAL EXAMINATION:  VITAL SIGNS: Temperature is 97.9, pulse 98, respirations 18, blood pressure 150/72, pulse oximetry is 98% on room air.  GENERAL: The patient is alert and oriented x 3, in no apparent distress.  HEENT: Normocephalic, atraumatic. Pupils equal, round, and reactive to light and accommodation. Extraocular movements are intact. Mucous membranes are moist.  NECK: Trachea is midline. No adenopathy. Thyroid is nonpalpable and nontender.  CHEST: Symmetric and atraumatic.  CARDIOVASCULAR: Regular rate and rhythm. Normal S1, S2. No rubs, clicks, or murmurs appreciated.  LUNGS: Clear to auscultation bilaterally. Normal effort and excursion.  ABDOMEN: Positive bowel sounds. Soft, nontender, nondistended. No hepatosplenomegaly.  GENITOURINARY: Deferred.  MUSCULOSKELETAL: The patient moves all 4 extremities equally. There is 5/5 strength in the upper and lower extremities bilaterally. The patient has a somewhat wide-based gait that is mildly unsteady. She requires assistance to the commode.  SKIN: Warm and dry. There are no rashes or lesions.  EXTREMITIES: No clubbing, cyanosis, or edema.  NEUROLOGIC: Cranial nerves II through XII are grossly intact.  PSYCHIATRIC: Mood is normal. Affect is congruent. The patient has very good judgment and insight into her medical condition.   ASSESSMENT AND PLAN: This is a 81 year old female admitted for a lower gastrointestinal bleed.   1. Gastrointestinal bleed. The patient has had bright red bleeding per rectum that is painless. We will obtain a CT of the abdomen, as well as orthostatic blood pressures. I have held the patient's aspirin for now. Notably, she had a diverticular bleed approximately 6 years ago when she was on Plavix following a stroke. She has not been on systemic anticoagulation aside from aspirin since that time. I have ordered a gastroenterology consult.  2. Hypertension is controlled. The patient has  not been on blood pressure medicines since her recent discharge from the hospital due to generalized weakness.  3. Diabetes, type 2. I will place the patient on sliding scale insulin while hospitalized.  4. Chronic obstructive pulmonary disease. We will resume patient's inhalers per her home regimen.  5. Deep vein thrombosis prophylaxis: Sequential compression devices.  6. Gastrointestinal prophylaxis: IV pantoprazole.   CODE STATUS: The patient is a FULL CODE.   TIME SPENT ON ADMISSION ORDERS AND PATIENT CARE: Approximately 35 minutes.    ____________________________ Norva Riffle. Marcille Blanco, MD msd:JT D: 03/04/2015 07:48:57 ET T: 03/04/2015 09:24:34 ET JOB#: [id number]  cc: Norva Riffle. Marcille Blanco, MD, <Dictator> Norva Riffle Zamara Cozad MD ELECTRONICALLY SIGNED 03/25/2015 9:35

## 2015-04-05 NOTE — Discharge Summary (Signed)
PATIENT NAME:  Joann Wilkinson, Joann Wilkinson MR#:  [id number] DATE OF BIRTH:  06/14/1922  DATE OF ADMISSION:  02/16/2015 DATE OF DISCHARGE:  02/19/2015  PRIMARY CARE PHYSICIAN:  Paulita Cradle, PA at [facility].  DISCHARGE DIAGNOSES: Pneumonia, hyponatremia, diabetes, hypertension, chronic obstructive pulmonary disease.   CONDITION: Stable.   CODE STATUS: DO NOT RESUSCITATE.   HOME MEDICATIONS: Please refer to the medication reconciliation list.   DIET:  Regular.   ACTIVITY: As tolerated.   FOLLOW-UP CARE:  PCP within 1-2 weeks.   REASON FOR ADMISSION: Pneumonia.   HOSPITAL COURSE: The patient is a 81 year old Caucasian female with a history of COPD, lung cancer, was sent to the hospital for direct admission from PCPs office due to 3 weeks of  continued coughing with productive sputum. The patient was treated with a 7 day course of ceftin and 3 days of Levaquin without improvement. Also, the patient's sodium was 123. For detailed history and physical examination, please refer to the admission note dictated by Dr. Volanda Napoleon.  1. For pneumonia. After admission, the patient has been treated with vancomycin and meropenem. The patient's chest x-ray did not showed pneumonia but a CT scan of the chest showed right middle lobe pneumonia. The patient's symptoms have much improved. She has no complaints.  2. For hyponatremia. The patient has been treated with normal saline and a regular diet. The patient's sodium increased to 132 today. 3. Diabetes is controlled.  4. Hypertension is controlled with Diovan.  5. The patient also has COPD, which is stable.   PHYSICAL EXAMINATION:   The patient's vital signs are stable. Physical examination is unremarkable.   She underwent physical therapy evaluation, she tolerated PT well, does not need home PT. The patient is stable. Will be discharged to home today. Discussed the patient's discharge plan with the patient, the patient's son, nurse, and the case manager.    TIME SPENT: About 38 minutes.    ____________________________ Demetrios Loll, MD qc:tr D: 02/19/2015 16:05:53 ET T: 02/19/2015 16:13:55 ET JOB#: [id number]  cc: Demetrios Loll, MD, <Dictator> Demetrios Loll MD ELECTRONICALLY SIGNED 02/19/2015 16:51

## 2015-04-05 NOTE — Consult Note (Signed)
PATIENT NAME:  Joann Wilkinson, Joann Wilkinson MR#:  [id number] DATE OF BIRTH:  20-Jul-1922  DATE OF CONSULTATION:  03/04/2015  REFERRING PHYSICIAN:   CONSULTING PHYSICIAN:  Manya Silvas, MD  HISTORY OF PRESENT ILLNESS:  The patient is a 81 year old white female who had the onset of painless bloody passages from the colon, passed bright red blood and clots and came to the hospital and was admitted for presumed diverticular bleeding. I was asked to see her in consultation.   The patient had a recent discharge from the hospital a few weeks ago for pneumonia.   REVIEW OF SYSTEMS: There is some weakness. She denies chest pains. Denies shortness of breath. No dizziness. No nausea or vomiting.   PAST MEDICAL HISTORY: Diverticulosis, hypertension, type 2 diabetes, COPD, previous history of lung cancer which she underwent surgery and was cured, previous cerebrovascular occlusion of retinal artery, history of oral cancer.   SOCIAL HISTORY:  The patient was a smoker, does no longer smoke.   PAST SURGICAL HISTORY: Right upper lobectomy, elbow reconstruction, salivary gland removal, left femur surgery, hemorrhoid surgery, hysterectomy, and bilateral cataracts.   FAMILY HISTORY: Positive for heart disease.   MEDICATIONS ON ADMISSION:  Acetaminophen with Benadryl 500 mg/25 mg 2 tablets at bedtime, aspirin 81 mg a day, Celebrex 2 mg 1-2 capsules daily, Tylenol 2 tablets q. 6 p.r.n., glipizide extended release 2.5 mg daily, Advair 1 puff inhaled twice a day, Ventolin inhaler 2 puffs 4 times a day as needed, Lasix 20 mg a day, ferrous sulfate 325 mg a day, fluticasone 2 sprays once a day, Detrol LA 2 mg at bedtime, 4 mg in the morning, MultiVites 1 a day, vitamin C 1 a day.   ALLERGIES: CODEINE, LODINE, MORPHINE, AND TRAMADOL.   PHYSICAL EXAMINATION:  GENERAL: Elderly, pleasant white female in no acute distress. Answers questions appropriately.  VITAL SIGNS:  Temperature 99.5, pulse 107, respirations 20, blood  pressure 98/53, 96% saturation on room air. Elderly white female in no acute distress.  HEENT: Sclerae nonicteric. Conjunctivae not pale. Tongue negative. Head is atraumatic.  CHEST: Clear.  HEART: No murmurs or gallops that I could hear.  ABDOMEN: Nontender. No hepatosplenomegaly.   LABORATORY DATA: Hemoglobin on admission 11.5 at 4:00 a.m. today, hemoglobin at 1340 today is 10.2. White count is 7.9, platelet count 227,000. BUN 24, creatinine 0.99, sodium 133, potassium 4.7, chloride 96, CO2 of 28, calcium 8.6. Liver panel negative except for albumin 3.2. Glucose 131.    ASSESSMENT: A patient with diverticular bleed who appears to have stopped bleeding, had 1 bloody passage early this morning. I explained to the patient there is an 80% plus chance that there will not be any further bleeding and that given her age and the fact that the bleeding may have stopped we will observe her for now and not put her through any colonoscopy or other studies unless she rebleeds again in a significant manner, then possibility of gastrointestinal bleeding scan followed by arteriography was discussed with them.  I will follow with you.      ____________________________ Manya Silvas, MD rte:bu D: 03/04/2015 16:07:04 ET T: 03/04/2015 16:26:22 ET JOB#: [id number]  cc: Manya Silvas, MD, <Dictator> Vivek J. Verdell Carmine, MD Loney Hering, MD  Manya Silvas MD ELECTRONICALLY SIGNED 03/21/2015 16:13

## 2015-04-05 NOTE — Discharge Summary (Signed)
PATIENT NAME:  Joann Wilkinson, Joann Wilkinson MR#:  [id number] DATE OF BIRTH:  [birth year]  DATE OF ADMISSION:  03/04/2015 DATE OF DISCHARGE:  03/06/2015  For a detailed note, please take a look at the history and physical done on admission by Dr. Rosilyn Mings.   DISCHARGE DIAGNOSES:  1.  Gastrointestinal bleed, likely secondary to diverticulosis. 2.  Diabetes. 3.  Chronic obstructive pulmonary disease. 4.  History of urinary incontinence. 5.  History of osteoarthritis.   DISCHARGE DIET: The patient is being discharged in a low-sodium, low-controlled diet.   DISCHARGE ACTIVITY: As tolerated.   DISCHARGE FOLLOWUP: Follow up with Boykin Reaper in the next 1 to 2 weeks.  DISCHARGE MEDICATIONS: Detrol LA 2 mg at bedtime, Lasix 20 mg daily, glipizide XL 2.5 mg daily, Detrol LA 4 mg in the morning, albuterol inhaler 2 puffs 4 times daily as needed, Celebrex 200 mg 1 to 2 caps daily as needed, Flonase 2 sprays to each nostril daily, aspirin 81 mg daily, vitamin C 500 mg daily, multivitamin daily, Tylenol with Benadryl 2 tabs at bedtime, Tylenol extra-strength 500 mg tablets 2 tabs q. 6 hours as needed, Advair 250/50 one puff b.i.d.   CONSULTANTS DURING HOSPITAL COURSE: Dr. Vira Agar from gastroenterology.   PERTINENT STUDIES DONE DURING THE HOSPITAL COURSE: CT scan of the abdomen and pelvis done with contrast showed no acute finding. No specific cause of gastrointestinal hemorrhage. Fairly large amount of stool throughout the colon.   BRIEF HOSPITAL COURSE: This is a 81 year old female with medical problems as mentioned above who presented to the hospital with hematochezia and multiple episodes of bright red blood per rectum. 1.  GI bleed. This was likely a lower GI bleed given the patient's hematochezia. It was to be a diverticular bleed. The patient has a previous history of diverticulosis, based on a colonoscopy in 2011. The patient was seen by gastroenterology. They did not want to pursue any aggressive  intervention given her advanced age. The patient was treated with supportive care. The patient's bleeding stopped shortly after admission. Her diet was slowly advanced from a clear liquid eventually to a low residue, which she is currently tolerating with no evidence of further bleeding and her hemoglobin being stable. She is therefore being discharged home. Her aspirin and Celebrex were held, although she can resume those upon discharge. 2.  COPD. The patient had no acute COPD exacerbation. She will continue her Advair. 3.  Diabetes. The patient had no evidence of hypoglycemia. She was maintained on sliding scale insulin while in the hospital. She will resume glipizide upon discharge.  4.  Urinary incontinence. She will continue her tolterodine.  5.  History of osteoarthritis. The patient will resume her Celebrex upon discharge.   DISPOSITION: The patient is being discharged home with home health physical therapy and nursing services.   TIME SPENT ON DISCHARGE: 40 minutes.   ____________________________ Belia Heman. Verdell Carmine, MD vjs:sb D: 03/06/2015 15:40:26 ET T: 03/06/2015 16:08:35 ET JOB#: [id number]  cc: Belia Heman. Verdell Carmine, MD, <Dictator> [address] [place], [place] Henreitta Leber MD ELECTRONICALLY SIGNED 03/12/2015 16:22

## 2015-04-05 NOTE — H&P (Signed)
PATIENT NAME:  Joann Wilkinson, Joann Wilkinson MR#:  [id number] DATE OF BIRTH:  07/05/1922  DATE OF ADMISSION:  02/16/2015  PRIMARY CARE PROVIDER:  Paulita Cradle, PA-C at [facility].   PRIMARY PULMONOLOGIST:   Wallene Huh, MD.    HISTORY OF PRESENT ILLNESS: This very pleasant 81 year old woman is directly admitted from her primary care physician's office for further evaluation of 3 weeks of continued coughing productive of green sputum with occasional hemoptysis. She has had been treated with a 7 day course of Ceftin and 3 days of Levaquin with no improvement in symptoms. She also feels very weak and has been having soaking night sweats. On evaluation by her primary care provider today it was found that she was hyponatremic with a serum sodium of 123, decreased from 131 at last check.   PAST MEDICAL HISTORY:  1. History of lung cancer in remission for the past 6 years.  2. COPD.   3. Coronary artery disease.  4. Diabetes mellitus type 2.  5. Hypertension.  6. Osteoarthritis and degenerative joint disease of the spine.   SOCIAL HISTORY: The patient lives alone. She uses a walker for ambulation. She quit smoking 40 years ago. She does not drink alcohol or use illicit substances.   FAMILY MEDICAL HISTORY: Positive for coronary artery disease in both parents. No history of stroke.   HOME MEDICATIONS:  1. Acetaminophen 500 mg as needed for fever every 6 hours.  2. Advair Diskus 250-50 mcg 1 puff inhaled every 12 hours.  3. Aspirin 81 mg daily.  4. Celebrex 200 mg 1 tablet as needed.  5. Ferrous sulfate 325 mg 1 tablet daily.  6. Fluticasone Flonase 50 mcg nasal spray 2 sprays into both nostrils at bedtime.  7. Lasix 20 mg 1 tablet once a day.  8. Glipizide 5 mg 1 tablet once a day.  9. Levofloxacin 750 mg 1 tablet daily.  10. Simcor 500-20 mg 1 tablet daily.  11. Detrol LA 2 mg every day in the evening and 4 mg in the morning.  12. Diovan 80 mg 1 tablet daily.    REVIEW OF SYSTEMS:   CONSTITUTIONAL: Positive for sweats, weakness, subjective fevers. No change in weight.  HEENT: No change in vision or hearing. No pain in the eyes or ears. No sore throat or difficulty swallowing.  RESPIRATORY: Positive for cough, hemoptysis, sputum production, wheezing, shortness of breath with exertion.  CARDIOVASCULAR: No chest pain, palpitations, edema, or syncope.  GASTROINTESTINAL:  No nausea, vomiting, diarrhea, or abdominal pain.  GENITOURINARY: No dysuria or frequency.  MUSCULOSKELETAL: No new pain in the neck, back, knees, shoulders, or hips.  SKIN: No new skin rashes or lesions.  NEUROLOGIC: No focal weakness, numbness, confusion, dementia, CVA, or headache.  PSYCHIATRIC: No bipolar disorder or schizophrenia.   PHYSICAL EXAMINATION:  VITAL SIGNS: Temperature 98 degrees, pulse 102, respirations 18, blood pressure 157/77, oxygenation 99% at rest on room air.  GENERAL: No acute distress.  HEENT: Pupils equal, round, and reactive to light. Conjunctivae are clear. Extraocular motion intact. Oral mucous membranes pink and moist. Posterior oropharynx clear with no exudate, edema, or erythema. Trachea is midline.  NECK: Supple. No cervical lymphadenopathy.  RESPIRATORY: There are bibasilar crackles with good air movement. No respiratory distress.  CARDIOVASCULAR: Regular rate and rhythm. No murmurs, rubs, or gallops. No peripheral edema. Peripheral pulses are 2 +.  ABDOMEN: Soft, nontender, nondistended. No guarding, no rebound. Bowel sounds are normal.  MUSCULOSKELETAL: Joints are normal with no joint effusions. Range of  motion normal. Strength 5 out of 5 throughout.  NEUROLOGIC: Cranial nerves II through XII grossly intact. Strength and sensation intact. Nonfocal.  PSYCHIATRIC: The patient alert and oriented with good insight into her clinical condition.   LABORATORY DATA:  White blood cells 7.4, hemoglobin 11.7, MCV is 104. Glucose 214, sodium 123, potassium 4.6, chloride 91,  bicarbonate 23, BUN 20, creatinine 1.0. LFTs are normal. UA with 4-10 white blood cells.   Chest x-ray taken at [facility] is not visible by me, but the report is of abnormal tenting under the left lung.   ASSESSMENT AND PLAN:  This is a 81 year old woman with past medical history of coronary artery disease, chronic obstructive pulmonary disease, history of lung cancer in the past, diabetes, who was directly admitted from her primary care office for concern of persistent coughing, sputum production, hemoptysis, soaking night sweats for the past 3 weeks, which has not improved with outpatient treatment.   1.  Pneumonia with tachycardia: Will repeat the chest x-ray, to better evaluate her lungs. Blood cultures, sputum cultures, urine Legionella, and streptococcal antigen all pending. We will go ahead and start vancomycin and Zosyn for broad spectrum coverage as she has not responded to outpatient therapy and has been on a 10 day course of fairly broad spectrum antibiotics. We will also obtain a CT scan of the lungs if chest x-ray is not revealing.  2.  Hyponatremia likely due to lung process, also decreased p.o. intake during this prolonged illness: We will start normal saline to replace serum sodium. We will check urine sodium and creatinine.  3.  Diabetes mellitus: Check hemoglobin A1c and continue with sliding scale insulin while inpatient.  4.  Hypertension: Continue with Diovan.  5.  Chronic obstructive pulmonary disease: Continue with Advair as she is not currently having evidence of a COPD exacerbation, no wheezing, tachypnea, or hypoxia.  6.  Prophylaxis: Will use SCDs for DVT prophylaxis. She can continue on Protonix as she is on this as an outpatient.  7.  Disposition: The patient is a Do Not Resuscitate. I discussed this with the patient with her daughter present on admission.   TIME SPENT ON ADMISSION: 45 minutes.     ____________________________ Earleen Newport. Volanda Napoleon,  MD cpw:bu D: 02/16/2015 20:38:21 ET T: 02/16/2015 21:08:38 ET JOB#: [id number]  cc: Earleen Newport. Volanda Napoleon, MD, <Dictator> Aldean Jewett MD ELECTRONICALLY SIGNED 02/24/2015 10:19

## 2015-09-23 ENCOUNTER — Encounter: Payer: Self-pay | Admitting: *Deleted

## 2015-09-23 ENCOUNTER — Inpatient Hospital Stay
Admission: EM | Admit: 2015-09-23 | Discharge: 2015-09-26 | DRG: 689 | Disposition: A | Discharge status: Home Health | Payer: Medicare Other | Attending: Specialist | Admitting: Specialist

## 2015-09-23 ENCOUNTER — Emergency Department: Payer: Medicare Other

## 2015-09-23 DIAGNOSIS — N3000 Acute cystitis without hematuria: Secondary | ICD-10-CM | POA: Diagnosis present

## 2015-09-23 DIAGNOSIS — Z23 Encounter for immunization: Secondary | ICD-10-CM

## 2015-09-23 DIAGNOSIS — Z87891 Personal history of nicotine dependence: Secondary | ICD-10-CM | POA: Diagnosis not present

## 2015-09-23 DIAGNOSIS — Z9109 Other allergy status, other than to drugs and biological substances: Secondary | ICD-10-CM | POA: Diagnosis not present

## 2015-09-23 DIAGNOSIS — G9341 Metabolic encephalopathy: Secondary | ICD-10-CM | POA: Diagnosis present

## 2015-09-23 DIAGNOSIS — R32 Unspecified urinary incontinence: Secondary | ICD-10-CM | POA: Diagnosis present

## 2015-09-23 DIAGNOSIS — J440 Chronic obstructive pulmonary disease with acute lower respiratory infection: Secondary | ICD-10-CM | POA: Diagnosis present

## 2015-09-23 DIAGNOSIS — I10 Essential (primary) hypertension: Secondary | ICD-10-CM | POA: Diagnosis present

## 2015-09-23 DIAGNOSIS — Z885 Allergy status to narcotic agent status: Secondary | ICD-10-CM | POA: Diagnosis not present

## 2015-09-23 DIAGNOSIS — G9340 Encephalopathy, unspecified: Secondary | ICD-10-CM | POA: Diagnosis present

## 2015-09-23 DIAGNOSIS — Z8249 Family history of ischemic heart disease and other diseases of the circulatory system: Secondary | ICD-10-CM | POA: Diagnosis not present

## 2015-09-23 DIAGNOSIS — Z87828 Personal history of other (healed) physical injury and trauma: Secondary | ICD-10-CM

## 2015-09-23 DIAGNOSIS — Z87440 Personal history of urinary (tract) infections: Secondary | ICD-10-CM

## 2015-09-23 DIAGNOSIS — N289 Disorder of kidney and ureter, unspecified: Secondary | ICD-10-CM

## 2015-09-23 DIAGNOSIS — E119 Type 2 diabetes mellitus without complications: Secondary | ICD-10-CM | POA: Diagnosis present

## 2015-09-23 DIAGNOSIS — Z7984 Long term (current) use of oral hypoglycemic drugs: Secondary | ICD-10-CM | POA: Diagnosis not present

## 2015-09-23 DIAGNOSIS — Z7982 Long term (current) use of aspirin: Secondary | ICD-10-CM | POA: Diagnosis not present

## 2015-09-23 DIAGNOSIS — Z79899 Other long term (current) drug therapy: Secondary | ICD-10-CM

## 2015-09-23 DIAGNOSIS — E871 Hypo-osmolality and hyponatremia: Secondary | ICD-10-CM | POA: Diagnosis present

## 2015-09-23 DIAGNOSIS — Z8673 Personal history of transient ischemic attack (TIA), and cerebral infarction without residual deficits: Secondary | ICD-10-CM | POA: Diagnosis not present

## 2015-09-23 DIAGNOSIS — N390 Urinary tract infection, site not specified: Secondary | ICD-10-CM

## 2015-09-23 HISTORY — DX: Type 2 diabetes mellitus without complications: E11.9

## 2015-09-23 HISTORY — DX: Cerebral infarction, unspecified: I63.9

## 2015-09-23 HISTORY — DX: Chronic obstructive pulmonary disease, unspecified: J44.9

## 2015-09-23 LAB — BASIC METABOLIC PANEL
Anion gap: 9 (ref 5–15)
BUN: 22 mg/dL — AB (ref 6–20)
CHLORIDE: 95 mmol/L — AB (ref 101–111)
CO2: 26 mmol/L (ref 22–32)
CREATININE: 1.1 mg/dL — AB (ref 0.44–1.00)
Calcium: 9.3 mg/dL (ref 8.9–10.3)
GFR calc Af Amer: 49 mL/min — ABNORMAL LOW (ref >60)
GFR, EST NON AFRICAN AMERICAN: 43 mL/min — AB (ref >60)
GLUCOSE: 158 mg/dL — AB (ref 65–99)
POTASSIUM: 4.3 mmol/L (ref 3.5–5.1)
SODIUM: 130 mmol/L — AB (ref 135–145)

## 2015-09-23 LAB — TROPONIN I

## 2015-09-23 LAB — URINALYSIS COMPLETE WITH MICROSCOPIC (ARMC ONLY)
BACTERIA UA: NONE SEEN
BILIRUBIN URINE: NEGATIVE
GLUCOSE, UA: NEGATIVE mg/dL
Hgb urine dipstick: NEGATIVE
Ketones, ur: NEGATIVE mg/dL
Nitrite: NEGATIVE
Protein, ur: 30 mg/dL — AB
Specific Gravity, Urine: 1.008 (ref 1.005–1.030)
pH: 6 (ref 5.0–8.0)

## 2015-09-23 LAB — CBC
HEMATOCRIT: 40.1 % (ref 35.0–47.0)
Hemoglobin: 13.9 g/dL (ref 12.0–16.0)
MCH: 34.9 pg — ABNORMAL HIGH (ref 26.0–34.0)
MCHC: 34.7 g/dL (ref 32.0–36.0)
MCV: 100.5 fL — AB (ref 80.0–100.0)
PLATELETS: 188 10*3/uL (ref 150–440)
RBC: 3.99 MIL/uL (ref 3.80–5.20)
RDW: 12.6 % (ref 11.5–14.5)
WBC: 8.2 10*3/uL (ref 3.6–11.0)

## 2015-09-23 MED ORDER — ONDANSETRON HCL 4 MG PO TABS: 4.0000 mg | ORAL_TABLET | Freq: Four times a day (QID) | ORAL | Status: DC | PRN

## 2015-09-23 MED ORDER — SODIUM CHLORIDE 0.9 % IV SOLN
INTRAVENOUS | Status: DC
  Administered 2015-09-24: 75 mL via INTRAVENOUS

## 2015-09-23 MED ORDER — ONDANSETRON HCL 4 MG/2ML IJ SOLN
4.0000 mg | Freq: Four times a day (QID) | INTRAMUSCULAR | Status: DC | PRN
Start: 2015-09-23 — End: 2015-09-26

## 2015-09-23 MED ORDER — ACETAMINOPHEN 325 MG PO TABS: 650.0000 mg | ORAL_TABLET | Freq: Four times a day (QID) | ORAL | Status: DC | PRN

## 2015-09-23 MED ORDER — SODIUM CHLORIDE 0.9 % IV SOLN
500.0000 mg | Freq: Two times a day (BID) | INTRAVENOUS | Status: DC
  Administered 2015-09-24 – 2015-09-25 (×4): 500 mg via INTRAVENOUS
  Filled 2015-09-23 (×5): qty 0.5

## 2015-09-23 MED ORDER — HEPARIN SODIUM (PORCINE) 5000 UNIT/ML IJ SOLN
5000.0000 [IU] | Freq: Three times a day (TID) | INTRAMUSCULAR | Status: DC
  Administered 2015-09-24 – 2015-09-26 (×8): 5000 [IU] via SUBCUTANEOUS
  Filled 2015-09-23 (×8): qty 1

## 2015-09-23 MED ORDER — ACETAMINOPHEN 650 MG RE SUPP: 650.0000 mg | Freq: Four times a day (QID) | RECTAL | Status: DC | PRN

## 2015-09-23 MED ORDER — OXYCODONE HCL 5 MG PO TABS: 5.0000 mg | ORAL_TABLET | ORAL | Status: DC | PRN

## 2015-09-23 NOTE — ED Notes (Signed)
Pt. Here for multiple UTI's in the past couple weeks.  Pt. Has been on multiple antibiotics.  Pt. Is currently on Augmentin.  Pt. States urine frequency and confusion today.

## 2015-09-23 NOTE — ED Notes (Signed)
Pt. Was started on Cipro 250 mg on 9/19.  Pt. Urine culture resulted with e-coli strain.  Pt. Was then switched to macrobid 100 mg for 7 days.  Pt. Was then put on ceftin on 10/3 250 mg bid for 7 days.  Pt. Was put on Augmentin 500 mg 10/17 bid for 10 day. from another urine specimen result.  Pt. Had lab results done at [facility] clinic.

## 2015-09-23 NOTE — ED Provider Notes (Signed)
[facility] Emergency Department Provider Note  ____________________________________________  Time seen: Approximately 9:06 PM  I have reviewed the triage vital signs and the nursing notes.   HISTORY  Chief Complaint Urinary Tract Infection  3 is limited due to patient altered mental status; the majority of the history is obtained from the patient's daughter.  HPI Joann Wilkinson is a 81 y.o. female him in from home with a history of recurrent UTI presenting with altered mental status. The patient is unable to give me a history due to her altered mental status.Her daughter states, that she has in on multiple antibiotics since the end of September for recurrent UTI. Her medical record shows that she has been on Macrobid, ciprofloxacin, Cefitin.  Today, the patient's daughter called and the patient was "talking out of her head." When the patient's son arrived, the microwave was on, and the patient was unable to perform her usual ADLs. She was unable to say the year or the date. Otherwise, the patient has had low-grade temperatures up to 99.5 but no fever, no nausea or vomiting, no diarrhea, no abdominal pain. There is no history of trauma.   No past medical history on file. Recurrent UTI There are no active problems to display for this patient.   No past surgical history on file.  No current outpatient prescriptions on file.  Allergies Review of patient's allergies indicates no known allergies.  No family history on file.  Social History Social History  Substance Use Topics  . Smoking status: Never Smoker   . Smokeless tobacco: Not on file  . Alcohol Use: No    Review of Systems Constitutional: Low-grade temperatures but no fever or chills. No syncope. Altered mental status Eyes: No visual changes. ENT: No sore throat. Cardiovascular: Denies chest pain, palpitations. Respiratory: Denies shortness of breath.  No cough. Gastrointestinal: No  abdominal pain.  No nausea, no vomiting.  No diarrhea.  No constipation. Genitourinary: Negative for dysuria. Musculoskeletal: Negative for back pain. Skin: Negative for rash. Neurological: Negative for headaches, focal weakness or numbness.  10-point ROS otherwise negative.  ____________________________________________   PHYSICAL EXAM:  VITAL SIGNS: ED Triage Vitals  Enc Vitals Group     BP 09/23/15 1841 164/83 mmHg     Pulse Rate 09/23/15 1841 103     Resp 09/23/15 1841 18     Temp 09/23/15 1841 98.6 F (37 C)     Temp Source 09/23/15 1841 Oral     SpO2 09/23/15 1841 95 %     Weight 09/23/15 1841 138 lb (62.596 kg)     Height 09/23/15 1841 '5\' 2"'$  (1.575 m)     Head Cir --      Peak Flow --      Pain Score 09/23/15 1853 5     Pain Loc --      Pain Edu? --      Excl. in [place]? --     Constitutional: Alert and oriented. Well appearing and in no acute distress. Answer question appropriately. Eyes: Conjunctivae are normal.  EOMI. Head: Atraumatic. Nose: No congestion/rhinnorhea. Mouth/Throat: Mucous membranes are moist.  Neck: No stridor.  Supple.  No meningismus. Cardiovascular: Normal rate, regular rhythm. No murmurs, rubs or gallops.  Respiratory: Normal respiratory effort.  No retractions. Lungs CTAB.  No wheezes, rales or ronchi. Gastrointestinal: Soft and nontender. No distention. No peritoneal signs. Musculoskeletal: No LE edema.  Neurologic: Alert and oriented 3; she knows her name and the year/month but these are  difficult for her to remember. Speech is clear. EOMI and PERRLA Naming and repetition are intact. Face and smile symmetric. Moves all extremities well   Skin:  Skin is warm, dry and intact. No rash noted. Psychiatric: Mood and affect are normal. Speech and behavior are normal.  Normal judgement.  ____________________________________________   LABS (all labs ordered are listed, but only abnormal results are displayed)  Labs Reviewed  BASIC METABOLIC  PANEL - Abnormal; Notable for the following:    Sodium 130 (*)    Chloride 95 (*)    Glucose, Bld 158 (*)    BUN 22 (*)    Creatinine, Ser 1.10 (*)    GFR calc non Af Amer 43 (*)    GFR calc Af Amer 49 (*)    All other components within normal limits  CBC - Abnormal; Notable for the following:    MCV 100.5 (*)    MCH 34.9 (*)    All other components within normal limits  URINALYSIS COMPLETEWITH MICROSCOPIC ([facility] ONLY) - Abnormal; Notable for the following:    Color, Urine STRAW (*)    APPearance CLEAR (*)    Protein, ur 30 (*)    Leukocytes, UA 1+ (*)    Squamous Epithelial / LPF 0-5 (*)    All other components within normal limits  TROPONIN I   ____________________________________________  EKG  ED ECG REPORT I, Eula Listen, the attending physician, personally viewed and interpreted this ECG.   Date: 09/23/2015  EKG Time: 2126  Rate: 96  Rhythm: normal sinus rhythm  Axis: leftward  Intervals:none  ST&T Change: No STEMI.  ____________________________________________  RADIOLOGY  Ct Head Wo Contrast  09/23/2015  CLINICAL DATA:  Confusion, urinary frequency. EXAM: CT HEAD WITHOUT CONTRAST TECHNIQUE: Contiguous axial images were obtained from the base of the skull through the vertex without intravenous contrast. COMPARISON:  09/23/2009. FINDINGS: No evidence of an acute infarct, acute hemorrhage, mass lesion, mass effect or hydrocephalus. Atrophy. Confluent low-attenuation in the periventricular deep white matter. Paranasal sinuses are clear. IMPRESSION: 1. No acute intracranial abnormality. 2. Atrophy and chronic microvascular white matter ischemic changes. Electronically Signed   By: Lorin Picket M.D.   On: 09/23/2015 21:21    ____________________________________________   PROCEDURES  Procedure(s) performed: None  Critical Care performed: No ____________________________________________   INITIAL IMPRESSION / ASSESSMENT AND PLAN / ED  COURSE  Pertinent labs & imaging results that were available during my care of the patient were reviewed by me and considered in my medical decision making (see chart for details).  81 y.o. female with recurrent UTI presenting for altered mental status. The patient's urine here is mostly unimpressive except for positive leukocyte Estrace. However given her recent history we will treat her for UTI and send the urinalysis for culture. She does have hyponatremia and renal insufficiency today this is likely due to dehydration because her BUN is also elevated. Will plan admission to the hospitalist.  ____________________________________________  FINAL CLINICAL IMPRESSION(S) / ED DIAGNOSES  Final diagnoses:  UTI (lower urinary tract infection)  Hyponatremia  Renal insufficiency      NEW MEDICATIONS STARTED DURING THIS VISIT:  New Prescriptions   No medications on file     Eula Listen, MD 09/23/15 2129

## 2015-09-23 NOTE — ED Notes (Signed)
Pt brought in by daughter via wheelchair.  Pt has taken 4 different antibiodics and today has urinary frequency and confusion.   Pt alert.  Speech clear.

## 2015-09-23 NOTE — H&P (Signed)
[place] [facility] [place] NAME: Joann Wilkinson    MR#:  [id number]  DATE OF BIRTH:  08-04-1922   DATE OF ADMISSION:  09/23/2015  PRIMARY CARE PHYSICIAN: Marinda Elk, MD   REQUESTING/REFERRING PHYSICIAN: Mariea Clonts  CHIEF COMPLAINT:   Chief Complaint  Patient presents with  . Urinary Tract Infection    HISTORY OF PRESENT ILLNESS:  Joann Wilkinson  is a 81 y.o. female with a known history of essential hypertension who is presenting with altered mental status. Mental status described as confusion by daughter is present at bedside stating that "she did not know how to turn the microwave off." The daughter describes this confusion for one day in total duration. She also attests to having some increased urinary frequency however denies any dysuria. Of note diagnosed with urinary tract infection has been on a multitude of antibiotics without improvement. PAST MEDICAL HISTORY:   Past Medical History  Diagnosis Date  . Diabetes mellitus without complication ([place])   . COPD (chronic obstructive pulmonary disease) ([place])   . Stroke ([place])   . Cancer ([place])     PAST SURGICAL HISTORY:  History reviewed. No pertinent past surgical history.  SOCIAL HISTORY:   Social History  Substance Use Topics  . Smoking status: Former Research scientist (life sciences)  . Smokeless tobacco: Not on file  . Alcohol Use: No    FAMILY HISTORY:   Family History  Problem Relation Age of Onset  . CAD Mother   . CAD Father     DRUG ALLERGIES:   Allergies  Allergen Reactions  . Codeine Nausea Only  . Darvon [Propoxyphene] Nausea Only  . Etodolac Nausea Only  . Lisinopril Cough  . Morphine And Related Nausea Only  . Nortriptyline Nausea Only  . Tramadol Nausea Only    REVIEW OF SYSTEMS:  REVIEW OF SYSTEMS: Question the validity of these statements CONSTITUTIONAL: Denies fevers, chills, fatigue, weakness.  EYES: Denies blurred vision, double vision, or eye pain.   EARS, NOSE, THROAT: Denies tinnitus, ear pain, hearing loss.  RESPIRATORY: denies cough, shortness of breath, wheezing  CARDIOVASCULAR: Denies chest pain, palpitations, edema.  GASTROINTESTINAL: Denies nausea, vomiting, diarrhea, abdominal pain.  GENITOURINARY: Denies dysuria, hematuria. Positive urinary frequency ENDOCRINE: Denies nocturia or thyroid problems. HEMATOLOGIC AND LYMPHATIC: Denies easy bruising or bleeding.  SKIN: Denies rash or lesions.  MUSCULOSKELETAL: Denies pain in neck, back, shoulder, knees, hips, or further arthritic symptoms.  NEUROLOGIC: Denies paralysis, paresthesias.  PSYCHIATRIC: Denies anxiety or depressive symptoms. Otherwise full review of systems performed by me is negative.   MEDICATIONS AT HOME:   Prior to Admission medications   Medication Sig Start Date End Date Taking? Authorizing Provider  albuterol (PROVENTIL HFA;VENTOLIN HFA) 108 (90 BASE) MCG/ACT inhaler Inhale 2 puffs into the lungs every 6 (six) hours as needed for wheezing or shortness of breath.   Yes Historical Provider, MD  amoxicillin-clavulanate (AUGMENTIN) 500-125 MG tablet Take 1 tablet by mouth 2 (two) times daily. 09/21/15 10/01/15 Yes Historical Provider, MD  aspirin EC 81 MG tablet Take 81 mg by mouth daily.   Yes Historical Provider, MD  CALCIUM-MAGNESIUM-VITAMIN D PO Take 2 tablets by mouth at bedtime.   Yes Historical Provider, MD  fluticasone (FLONASE) 50 MCG/ACT nasal spray Place 1 spray into both nostrils at bedtime.   Yes Historical Provider, MD  Fluticasone-Salmeterol (ADVAIR) 250-50 MCG/DOSE AEPB Inhale 1 puff into the lungs 2 (two) times daily.   Yes Historical Provider, MD  furosemide (LASIX) 20  MG tablet Take 20 mg by mouth daily.   Yes Historical Provider, MD  glipiZIDE (GLUCOTROL XL) 2.5 MG 24 hr tablet Take 2.5 mg by mouth daily as needed (for high blood sugar).   Yes Historical Provider, MD  tolterodine (DETROL LA) 2 MG 24 hr capsule Take 2 mg by mouth at bedtime.   Yes  Historical Provider, MD  tolterodine (DETROL LA) 4 MG 24 hr capsule Take 4 mg by mouth every morning.    Yes Historical Provider, MD      VITAL SIGNS:  Blood pressure 164/83, pulse 103, temperature 98.6 F (37 C), temperature source Oral, resp. rate 18, height '5\' 2"'$  (1.575 m), weight 138 lb (62.596 kg), SpO2 95 %.  PHYSICAL EXAMINATION:  VITAL SIGNS: Filed Vitals:   09/23/15 1841  BP: 164/83  Pulse: 103  Temp: 98.6 F (37 C)  Resp: 70   GENERAL:81 y.o.female currently in no acute distress.  HEAD: Normocephalic, atraumatic.  EYES: Pupils equal, round, reactive to light. Extraocular muscles intact. No scleral icterus.  MOUTH: Moist mucosal membrane. Dentition intact. No abscess noted.  EAR, NOSE, THROAT: Clear without exudates. No external lesions.  NECK: Supple. No thyromegaly. No nodules. No JVD.  PULMONARY: Clear to ascultation, without wheeze rails or rhonci. No use of accessory muscles, Good respiratory effort. good air entry bilaterally CHEST: Nontender to palpation.  CARDIOVASCULAR: S1 and S2. Regular rate and rhythm. No murmurs, rubs, or gallops. No edema. Pedal pulses 2+ bilaterally.  GASTROINTESTINAL: Soft, nontender, nondistended. No masses. Positive bowel sounds. No hepatosplenomegaly.  MUSCULOSKELETAL: No swelling, clubbing, or edema. Range of motion full in all extremities.  NEUROLOGIC: Cranial nerves II through XII are intact. No gross focal neurological deficits. Sensation intact. Reflexes intact.  SKIN: No ulceration, lesions, rashes, or cyanosis. Skin warm and dry. Turgor intact.  PSYCHIATRIC: Mood, affect within normal limits. The patient is awake, alert and oriented x 3. Insight, judgment intact.    LABORATORY PANEL:   CBC  Recent Labs Lab 09/23/15 1858  WBC 8.2  HGB 13.9  HCT 40.1  PLT 188   ------------------------------------------------------------------------------------------------------------------  Chemistries   Recent Labs Lab  09/23/15 1858  NA 130*  K 4.3  CL 95*  CO2 26  GLUCOSE 158*  BUN 22*  CREATININE 1.10*  CALCIUM 9.3   ------------------------------------------------------------------------------------------------------------------  Cardiac Enzymes  Recent Labs Lab 09/23/15 1858  TROPONINI <0.03   ------------------------------------------------------------------------------------------------------------------  RADIOLOGY:  Ct Head Wo Contrast  09/23/2015  CLINICAL DATA:  Confusion, urinary frequency. EXAM: CT HEAD WITHOUT CONTRAST TECHNIQUE: Contiguous axial images were obtained from the base of the skull through the vertex without intravenous contrast. COMPARISON:  09/23/2009. FINDINGS: No evidence of an acute infarct, acute hemorrhage, mass lesion, mass effect or hydrocephalus. Atrophy. Confluent low-attenuation in the periventricular deep white matter. Paranasal sinuses are clear. IMPRESSION: 1. No acute intracranial abnormality. 2. Atrophy and chronic microvascular white matter ischemic changes. Electronically Signed   By: Lorin Picket M.D.   On: 09/23/2015 21:21    EKG:   Orders placed or performed during the hospital encounter of 09/23/15  . ED EKG  . ED EKG    IMPRESSION AND PLAN:   81 year old Caucasian female history of essential hypertension who is presenting with confusion 1. Encephalopathy, secondary to urinary tract infection: She has received Cipro, Macrobid, Ceftin, Augmentin without any real improvement. She does have cultures that have been performed and are available on care everywhere base of these will dose with meropenem. Follow up dated culture data 2. Hyponatremia:  IV fluid hydration normal saline follow sodium level III. Type 2 diabetes non-insulin-requiring: Patient states currently not on medication will follow Accu-Cheks and blood sugar normal can discontinue these 4. Venous thromboembolism prophylactic: Heparin subcutaneous   All the records are reviewed  and case discussed with ED provider. Management plans discussed with the patient, family and they are in agreement.  CODE STATUS: Full  TOTAL TIME TAKING CARE OF THIS PATIENT: 35 minutes.    Hower,  Karenann Cai.D on 09/23/2015 at 11:53 PM  Between 7am to 6pm - Pager - [phone]  After 6pm: House Pager: - [phone]  [facility]  Office  [phone]  CC: Primary care physician; Marinda Elk, MD

## 2015-09-23 NOTE — ED Notes (Signed)
Pt. States urinary incontinence and confusion for the past couple days.

## 2015-09-24 LAB — BASIC METABOLIC PANEL
ANION GAP: 5 (ref 5–15)
BUN: 18 mg/dL (ref 6–20)
CO2: 28 mmol/L (ref 22–32)
Calcium: 8.7 mg/dL — ABNORMAL LOW (ref 8.9–10.3)
Chloride: 102 mmol/L (ref 101–111)
Creatinine, Ser: 0.95 mg/dL (ref 0.44–1.00)
GFR, EST AFRICAN AMERICAN: 59 mL/min — AB (ref >60)
GFR, EST NON AFRICAN AMERICAN: 51 mL/min — AB (ref >60)
Glucose, Bld: 134 mg/dL — ABNORMAL HIGH (ref 65–99)
POTASSIUM: 3.6 mmol/L (ref 3.5–5.1)
SODIUM: 135 mmol/L (ref 135–145)

## 2015-09-24 LAB — GLUCOSE, CAPILLARY
GLUCOSE-CAPILLARY: 160 mg/dL — AB (ref 65–99)
GLUCOSE-CAPILLARY: 155 mg/dL — AB (ref 65–99)
Glucose-Capillary: 145 mg/dL — ABNORMAL HIGH (ref 65–99)
Glucose-Capillary: 196 mg/dL — ABNORMAL HIGH (ref 65–99)
Glucose-Capillary: 188 mg/dL — ABNORMAL HIGH (ref 65–99)

## 2015-09-24 LAB — CBC
HEMATOCRIT: 36.8 % (ref 35.0–47.0)
HEMOGLOBIN: 12.6 g/dL (ref 12.0–16.0)
MCH: 34.3 pg — ABNORMAL HIGH (ref 26.0–34.0)
MCHC: 34.3 g/dL (ref 32.0–36.0)
MCV: 100.1 fL — AB (ref 80.0–100.0)
PLATELETS: 180 10*3/uL (ref 150–440)
RBC: 3.68 MIL/uL — ABNORMAL LOW (ref 3.80–5.20)
RDW: 12.5 % (ref 11.5–14.5)
WBC: 7.1 10*3/uL (ref 3.6–11.0)

## 2015-09-24 LAB — CREATININE, SERUM
Creatinine, Ser: 0.92 mg/dL (ref 0.44–1.00)
GFR calc non Af Amer: 53 mL/min — ABNORMAL LOW (ref >60)

## 2015-09-24 LAB — HEMOGLOBIN A1C: HEMOGLOBIN A1C: 6.8 % — AB (ref 4.0–6.0)

## 2015-09-24 MED ORDER — AMLODIPINE BESYLATE 5 MG PO TABS
5.0000 mg | ORAL_TABLET | Freq: Every day | ORAL | Status: DC
  Administered 2015-09-24 – 2015-09-26 (×3): 5 mg via ORAL
  Filled 2015-09-24 (×3): qty 1

## 2015-09-24 MED ORDER — MOMETASONE FURO-FORMOTEROL FUM 100-5 MCG/ACT IN AERO
2.0000 | INHALATION_SPRAY | Freq: Two times a day (BID) | RESPIRATORY_TRACT | Status: DC
  Administered 2015-09-24 – 2015-09-26 (×6): 2 via RESPIRATORY_TRACT
  Filled 2015-09-24: qty 8.8

## 2015-09-24 MED ORDER — FLUTICASONE PROPIONATE 50 MCG/ACT NA SUSP
1.0000 | Freq: Every day | NASAL | Status: DC
  Administered 2015-09-24 – 2015-09-25 (×3): 1 via NASAL
  Filled 2015-09-24: qty 16

## 2015-09-24 MED ORDER — SENNA 8.6 MG PO TABS
1.0000 | ORAL_TABLET | Freq: Every day | ORAL | Status: DC
  Administered 2015-09-24 – 2015-09-25 (×2): 8.6 mg via ORAL
  Filled 2015-09-24 (×2): qty 1

## 2015-09-24 MED ORDER — INFLUENZA VAC SPLIT QUAD 0.5 ML IM SUSY
0.5000 mL | PREFILLED_SYRINGE | INTRAMUSCULAR | Status: AC
  Administered 2015-09-25: 0.5 mL via INTRAMUSCULAR
  Filled 2015-09-24: qty 0.5

## 2015-09-24 MED ORDER — INSULIN ASPART 100 UNIT/ML ~~LOC~~ SOLN: 0.0000 [IU] | Freq: Every day | SUBCUTANEOUS | Status: DC

## 2015-09-24 MED ORDER — DOCUSATE SODIUM 100 MG PO CAPS
100.0000 mg | ORAL_CAPSULE | Freq: Two times a day (BID) | ORAL | Status: DC
  Administered 2015-09-24 – 2015-09-26 (×6): 100 mg via ORAL
  Filled 2015-09-24 (×6): qty 1

## 2015-09-24 MED ORDER — POLYETHYLENE GLYCOL 3350 17 G PO PACK
17.0000 g | PACK | Freq: Every day | ORAL | Status: DC
  Administered 2015-09-24 – 2015-09-26 (×3): 17 g via ORAL
  Filled 2015-09-24 (×3): qty 1

## 2015-09-24 MED ORDER — DIPHENHYDRAMINE HCL 25 MG PO CAPS
25.0000 mg | ORAL_CAPSULE | Freq: Every evening | ORAL | Status: DC | PRN
  Administered 2015-09-25: 25 mg via ORAL
  Filled 2015-09-24: qty 1

## 2015-09-24 MED ORDER — INSULIN ASPART 100 UNIT/ML ~~LOC~~ SOLN
0.0000 [IU] | Freq: Three times a day (TID) | SUBCUTANEOUS | Status: DC
Start: 2015-09-24 — End: 2015-09-26
  Administered 2015-09-24 – 2015-09-25 (×4): 2 [IU] via SUBCUTANEOUS
  Administered 2015-09-25: 3 [IU] via SUBCUTANEOUS
  Administered 2015-09-26: 1 [IU] via SUBCUTANEOUS
  Filled 2015-09-24 (×2): qty 2
  Filled 2015-09-24: qty 1
  Filled 2015-09-24: qty 3
  Filled 2015-09-24 (×2): qty 2

## 2015-09-24 MED ORDER — FESOTERODINE FUMARATE ER 4 MG PO TB24
4.0000 mg | ORAL_TABLET | Freq: Every day | ORAL | Status: DC
  Administered 2015-09-24 – 2015-09-26 (×3): 4 mg via ORAL
  Filled 2015-09-24 (×3): qty 1

## 2015-09-24 MED ORDER — ASPIRIN EC 81 MG PO TBEC
81.0000 mg | DELAYED_RELEASE_TABLET | Freq: Every day | ORAL | Status: DC
  Administered 2015-09-24 – 2015-09-26 (×3): 81 mg via ORAL
  Filled 2015-09-24 (×3): qty 1

## 2015-09-24 NOTE — Progress Notes (Signed)
[facility] [place] NAME: Caytlyn Evers    MR#:  [id number]  DATE OF BIRTH:  04-11-1922  SUBJECTIVE:  CHIEF COMPLAINT:   Chief Complaint  Patient presents with  . Urinary Tract Infection   the patient is a 81 year old Caucasian female with history of frequent urinary tract infections who presents to the hospital with worsening confusion for one day. He was noted to have pyuria and was admitted to the hospital for meropenem infusion since her prior urinary cultures were growing Escherichia coli, which were multiple antibiotic resistant. She feels a little bit better today and her confusion seemed to be resolving, per her daughter. Also severe constipation, patient complains of some dark stools. Suggested urology evaluation for frequent urinary tract infections. Suggested Senokot  Review of Systems  Unable to perform ROS: mental status change    VITAL SIGNS: Blood pressure 161/67, pulse 95, temperature 98.5 F (36.9 C), temperature source Oral, resp. rate 18, height '5\' 2"'$  (1.575 m), weight 62.596 kg (138 lb), SpO2 95 %.  PHYSICAL EXAMINATION:   GENERAL:  81 y.o.-year-old patient lying in the bed with no acute distress. Confused. Comfortable, able to communicate and answer questions, follows commands EYES: Pupils equal, round, reactive to light and accommodation. No scleral icterus. Extraocular muscles intact.  HEENT: Head atraumatic, normocephalic. Oropharynx and nasopharynx clear.  NECK:  Supple, no jugular venous distention. No thyroid enlargement, no tenderness.  LUNGS: Normal breath sounds bilaterally, no wheezing, rales,rhonchi or crepitation. No use of accessory muscles of respiration.  CARDIOVASCULAR: S1, S2 normal. No murmurs, rubs, or gallops.  ABDOMEN: Soft, nontender, nondistended. Bowel sounds present. No organomegaly or mass.  EXTREMITIES: No pedal edema, cyanosis, or clubbing.  NEUROLOGIC: Cranial nerves II through XII  are intact. Muscle strength 5/5 in all extremities. Sensation intact. Gait not checked.  PSYCHIATRIC: The patient is alert and oriented x 3.  SKIN: No obvious rash, lesion, or ulcer.   ORDERS/RESULTS REVIEWED:   CBC  Recent Labs Lab 09/23/15 1858 09/24/15 0552  WBC 8.2 7.1  HGB 13.9 12.6  HCT 40.1 36.8  PLT 188 180  MCV 100.5* 100.1*  MCH 34.9* 34.3*  MCHC 34.7 34.3  RDW 12.6 12.5   ------------------------------------------------------------------------------------------------------------------  Chemistries   Recent Labs Lab 09/23/15 1858 09/24/15 0552  NA 130* 135  K 4.3 3.6  CL 95* 102  CO2 26 28  GLUCOSE 158* 134*  BUN 22* 18  CREATININE 1.10* 0.95  0.92  CALCIUM 9.3 8.7*   ------------------------------------------------------------------------------------------------------------------ estimated creatinine clearance is 34.6 mL/min (by C-G formula based on Cr of 0.92). ------------------------------------------------------------------------------------------------------------------ No results for input(s): TSH, T4TOTAL, T3FREE, THYROIDAB in the last 72 hours.  Invalid input(s): FREET3  Cardiac Enzymes  Recent Labs Lab 09/23/15 [place] <0.03   ------------------------------------------------------------------------------------------------------------------ Invalid input(s): POCBNP ---------------------------------------------------------------------------------------------------------------  RADIOLOGY: Ct Head Wo Contrast  09/23/2015  CLINICAL DATA:  Confusion, urinary frequency. EXAM: CT HEAD WITHOUT CONTRAST TECHNIQUE: Contiguous axial images were obtained from the base of the skull through the vertex without intravenous contrast. COMPARISON:  09/23/2009. FINDINGS: No evidence of an acute infarct, acute hemorrhage, mass lesion, mass effect or hydrocephalus. Atrophy. Confluent low-attenuation in the periventricular deep white matter. Paranasal  sinuses are clear. IMPRESSION: 1. No acute intracranial abnormality. 2. Atrophy and chronic microvascular white matter ischemic changes. Electronically Signed   By: Lorin Picket M.D.   On: 09/23/2015 21:21    EKG:  Orders placed or performed during the hospital encounter of 09/23/15  .  ED EKG  . ED EKG    ASSESSMENT AND PLAN:  Active Problems:   Encephalopathy   Urinary tract infection  1. Metabolic encephalopathy due to urinary tract infection, seems to be resolving. Continue supportive therapy 2. Acute cystitis without hematuria, continue meropenem following culture results. Suggested urology consultation as outpatient due to recurrent cystitis episodes. Treat constipation, urinary retention.  3. Hyponatremia, resolved with IV fluid administration 4. Renal insufficiency resolved with IV fluid administration  Management plans discussed with the patient, family and they are in agreement.   DRUG ALLERGIES:  Allergies  Allergen Reactions  . Codeine Nausea Only  . Darvon [Propoxyphene] Nausea Only  . Etodolac Nausea Only  . Lisinopril Cough  . Morphine And Related Nausea Only  . Nortriptyline Nausea Only  . Tramadol Nausea Only    CODE STATUS:     Code Status Orders        Start     Ordered   09/23/15 2310  Full code   Continuous     09/23/15 2310    Advance Directive Documentation        Most Recent Value   Type of Advance Directive  Living will, Healthcare Power of Attorney   Pre-existing out of facility DNR order (yellow form or pink MOST form)     "MOST" Form in Place?        TOTAL TIME TAKING CARE OF THIS PATIENT: 50 minutes.  Coordination of care time 20 minutes  Allona Gondek M.D on 09/24/2015 at 2:32 PM  Between 7am to 6pm - Pager - [phone]  After 6pm go to [email or web address] - password [email or web address] [facility]  [facility]  Office  [phone]  CC: Primary care physician; Marinda Elk, MD

## 2015-09-25 ENCOUNTER — Inpatient Hospital Stay: Payer: Medicare Other

## 2015-09-25 DIAGNOSIS — N3000 Acute cystitis without hematuria: Secondary | ICD-10-CM | POA: Diagnosis not present

## 2015-09-25 LAB — GLUCOSE, CAPILLARY
GLUCOSE-CAPILLARY: 180 mg/dL — AB (ref 65–99)
GLUCOSE-CAPILLARY: 231 mg/dL — AB (ref 65–99)
Glucose-Capillary: 161 mg/dL — ABNORMAL HIGH (ref 65–99)
Glucose-Capillary: 139 mg/dL — ABNORMAL HIGH (ref 65–99)

## 2015-09-25 MED ORDER — AMOXICILLIN-POT CLAVULANATE 500-125 MG PO TABS
1.0000 | ORAL_TABLET | Freq: Two times a day (BID) | ORAL | Status: DC
  Administered 2015-09-25 – 2015-09-26 (×3): 500 mg via ORAL
  Filled 2015-09-25 (×3): qty 1

## 2015-09-25 NOTE — Progress Notes (Addendum)
Inpatient Diabetes Program Recommendations  AACE/ADA: New Consensus Statement on Inpatient Glycemic Control (2015)  Target Ranges:  Prepandial:   less than 140 mg/dL      Peak postprandial:   less than 180 mg/dL (1-2 hours)      Critically ill patients:  140 - 180 mg/dL   Review of Glycemic Control  Results for OLA, RAAP (MRN [id number]) as of 09/25/2015 08:04  Ref. Range 09/24/2015 07:15 09/24/2015 11:10 09/24/2015 16:12 09/24/2015 21:13 09/25/2015 07:46  Glucose-Capillary Latest Ref Range: 65-99 mg/dL 145 (H) 196 (H) 188 (H) 155 (H) 180 (H)    Diabetes history: Type 2   A1C 6.8% on 09/24/15 Outpatient Diabetes medications: Glucotrol 2.'5mg'$  q day for elevated sugars (as needed) Current orders for Inpatient glycemic control: Novolog 0-9 units tid with meals, Novolog 0-5 units qhs  Inpatient Diabetes Program Recommendations: Agree with current diabetes medications- CBG acceptable.    Gentry Fitz, RN, BA, MHA, CDE Diabetes Coordinator Inpatient Diabetes Program  [phone] (Team Pager) [phone] ([place]) 09/25/2015 8:06 AM

## 2015-09-25 NOTE — Evaluation (Signed)
Physical Therapy Evaluation Patient Details Name: Joann Wilkinson MRN: [id number] DOB: [birth year] Today's Date: 09/25/2015   History of Present Illness  Pt is a 81 y.o. female presenting to hospital with AMS x1 day.  Pt admitted with encephalopathy secondary to UTI.  PMH includes DM, COPD, stroke, CA.  Clinical Impression  Prior to admission, pt was independent with rollator in home and used RW in community; had assist for driving.  Pt lives alone but her son will be present next 1-2 weeks to assist as needed.  Currently pt is SBA with transfers and CGA to SBA with ambulation 200 feet with RW.  Pt would benefit from skilled PT to address noted impairments and functional limitations.  Recommend pt discharge to home with support of family when medically appropriate.     Follow Up Recommendations  (No PT follow up upon discharge from hospital; assist for stairs)    Equipment Recommendations  None recommended by PT    Recommendations for Other Services       Precautions / Restrictions Precautions Precautions: Fall Restrictions Weight Bearing Restrictions: No      Mobility  Bed Mobility Overal bed mobility: Needs Assistance Bed Mobility: Supine to Sit     Supine to sit: Supervision     General bed mobility comments: assist for lines  Transfers Overall transfer level: Needs assistance Equipment used: Rolling walker (2 wheeled) Transfers: Sit to/from Stand Sit to Stand: Supervision         General transfer comment: steady without loss of balance  Ambulation/Gait Ambulation/Gait assistance: Supervision;Min guard Ambulation Distance (Feet): 200 Feet Assistive device: Rolling walker (2 wheeled) Gait Pattern/deviations: WFL(Within Functional Limits)   Gait velocity interpretation: at or above normal speed for age/gender General Gait Details: steady without loss of balance (initially pt CGA then SBA)  Stairs            Wheelchair Mobility    Modified Rankin  (Stroke Patients Only)       Balance Overall balance assessment: Needs assistance Sitting-balance support: No upper extremity supported;Feet supported Sitting balance-Leahy Scale: Good     Standing balance support: Bilateral upper extremity supported (on RW) Standing balance-Leahy Scale: Good                               Pertinent Vitals/Pain Pain Assessment: No/denies pain  See flowsheet for HR and O2 vitals.    Home Living Family/patient expects to be discharged to:: Private residence Living Arrangements: Alone Available Help at Discharge: Family Type of Home: House Home Access: Stairs to enter Entrance Stairs-Rails: Right;Left;Can reach both Entrance Stairs-Number of Steps: [place]: One level Home Equipment: Environmental consultant - 2 wheels;Walker - 4 wheels      Prior Function Level of Independence: Independent with assistive device(s)         Comments: Pt uses rollator in home and RW in community; pt has assist with driving and someone always is present when pt is doing stairs.  Pt denies any recent falls.     Hand Dominance        Extremity/Trunk Assessment   Upper Extremity Assessment: Overall WFL for tasks assessed           Lower Extremity Assessment: Overall WFL for tasks assessed         Communication   Communication: No difficulties  Cognition Arousal/Alertness: Awake/alert Behavior During Therapy: WFL for tasks assessed/performed Overall Cognitive Status: Within Functional Limits for tasks  assessed                      General Comments   Nursing cleared pt for participation in physical therapy.  Pt agreeable to PT session.    Exercises   Performed semi-supine B LE therapeutic exercise x 10 reps:  Ankle pumps (AROM B LE's); quad sets x3 second holds (AROM B LE's); glute squeezes x3 second holds (AROM B); SAQ's (AROM R; AROM L); heelslides (AROM R; AROM L), hip abd/adduction (AROM R; AROM L).  Pt required vc's and tactile  cues for correct technique with exercises.       Assessment/Plan    PT Assessment Patient needs continued PT services  PT Diagnosis Difficulty walking   PT Problem List Decreased balance;Decreased mobility  PT Treatment Interventions DME instruction;Gait training;Stair training;Functional mobility training;Therapeutic activities;Therapeutic exercise;Balance training;Patient/family education   PT Goals (Current goals can be found in the Care Plan section) Acute Rehab PT Goals Patient Stated Goal: to go home PT Goal Formulation: With patient Time For Goal Achievement: 10/09/15 Potential to Achieve Goals: Good    Frequency Min 2X/week   Barriers to discharge        Co-evaluation               End of Session Equipment Utilized During Treatment: Gait belt Activity Tolerance: Patient tolerated treatment well Patient left: in chair;with call bell/phone within reach;with chair alarm set;with family/visitor present Nurse Communication: Mobility status         Time: 3710-6269 PT Time Calculation (min) (ACUTE ONLY): 24 min   Charges:   PT Evaluation $Initial PT Evaluation Tier I: 1 Procedure PT Treatments $Therapeutic Exercise: 8-22 mins   PT G CodesLeitha Bleak 16-Oct-2015, 11:58 AM Leitha Bleak, [place]

## 2015-09-25 NOTE — Consult Note (Signed)
[facility] Infectious Disease     Reason for Consult:Recurrent UTI   Referring Physician: Ether Griffins Date of Admission:  09/23/2015   Active Problems:   Encephalopathy   Urinary tract infection   HPI: Joann Wilkinson is a 81 y.o. female admitted 10/19 with AMS for 1-2 days. She has had urinary frequencey as well and been treated with oral abx as an otpt for recurrent UTI. On admit was afebrile, wbc nml. UA done otpt had >50 WBC and grew >100000 e coli.  Has been + since 9/19- initially treated with cipro 9/19 m then when found R changed to macrobid 9/21 for 7 days. sxs recurrred so 10/2 started cefuroxime x 7 days. Sx recurred again so started on augmentin 10/17.   Past Medical History  Diagnosis Date  . Diabetes mellitus without complication ([place])   . COPD (chronic obstructive pulmonary disease) ([place])   . Stroke ([place])   . Cancer [facility])    History reviewed. No pertinent past surgical history. Social History  Substance Use Topics  . Smoking status: Former Research scientist (life sciences)  . Smokeless tobacco: None  . Alcohol Use: No   Family History  Problem Relation Age of Onset  . CAD Mother   . CAD Father     Allergies:  Allergies  Allergen Reactions  . Codeine Nausea Only  . Darvon [Propoxyphene] Nausea Only  . Etodolac Nausea Only  . Lisinopril Cough  . Morphine And Related Nausea Only  . Nortriptyline Nausea Only  . Tramadol Nausea Only    Current antibiotics: Antibiotics Given (last 72 hours)    Date/Time Action Medication Dose Rate   09/24/15 0125 Given   meropenem (MERREM) 500 mg in sodium chloride 0.9 % 50 mL IVPB 500 mg 100 mL/hr   09/24/15 0949 Given   meropenem (MERREM) 500 mg in sodium chloride 0.9 % 50 mL IVPB 500 mg 100 mL/hr   09/24/15 2115 Given   meropenem (MERREM) 500 mg in sodium chloride 0.9 % 50 mL IVPB 500 mg 100 mL/hr   09/25/15 0811 Given   meropenem (MERREM) 500 mg in sodium chloride 0.9 % 50 mL IVPB 500 mg 100 mL/hr      MEDICATIONS: . amLODipine  5 mg  Oral Daily  . aspirin EC  81 mg Oral Daily  . docusate sodium  100 mg Oral BID  . fesoterodine  4 mg Oral Daily  . fluticasone  1 spray Each Nare QHS  . heparin  5,000 Units Subcutaneous 3 times per day  . Influenza vac split quadrivalent PF  0.5 mL Intramuscular Tomorrow-1000  . insulin aspart  0-5 Units Subcutaneous QHS  . insulin aspart  0-9 Units Subcutaneous TID WC  . meropenem (MERREM) IV  500 mg Intravenous Q12H  . mometasone-formoterol  2 puff Inhalation BID  . polyethylene glycol  17 g Oral Daily  . senna  1 tablet Oral QHS    Review of Systems - 11 systems reviewed and negative per HPI   OBJECTIVE: Temp:  [97.5 F (36.4 C)-98.9 F (37.2 C)] 97.5 F (36.4 C) (10/21 0726) Pulse Rate:  [92-113] 113 (10/21 1000) Resp:  [15-18] 18 (10/21 0726) BP: (140-177)/(50-85) 156/80 mmHg (10/21 0750) SpO2:  [95 %-97 %] 95 % (10/21 1000) Physical Exam  Constitutional:  oriented to person, place, and time. Frail HOH HENT: [place]/AT, PERRLA, no scleral icterus Mouth/Throat: Oropharynx is clear and moist. No oropharyngeal exudate.  Cardiovascular: Normal rate, regular rhythm and normal heart sounds. Exam reveals no gallop and no  friction rub.  No murmur heard.  Pulmonary/Chest: Effort normal and breath sounds normal. No respiratory distress.  has no wheezes.  Neck = supple, no nuchal rigidity  Abdominal: Soft. Bowel sounds are normal.  exhibits no distension. There is no tenderness.  Lymphadenopathy: no cervical adenopathy. No axillary adenopathy Neurological: alert and oriented to person, place, and time.  Skin: Skin is warm and dry. No rash noted. No erythema.  Psychiatric: a normal mood and affect.  behavior is normal.    LABS: 10/17 cx Urine Culture, Routine - Escherichia coli, identified by an automated biochemical system. Greater than 100,000 colony forming units per mL    Antibiotic                 RSLT#1    Amoxicillin/Clavulanic Acid    S Ampicillin                      R Cefepime                       S Ceftriaxone                    S Cefuroxime                     I Cephalothin                    I Ciprofloxacin                  R Ertapenem                      S Gentamicin                     S Imipenem                       S Levofloxacin                   R Nitrofurantoin                 S Piperacillin                   R Tetracycline                   R Tobramycin                     S Trimethoprim/Sulfa             R  9/19 Escherichia coli, identified by an automated biochemical system. Greater than 100,000 colony forming units per mL    Antibiotic                 RSLT#1     Amoxicillin/Clavulanic Acid    I Ampicillin                     R Cefepime                       S Ceftriaxone                    S Cefuroxime                     S Cephalothin  I Ciprofloxacin                  R Ertapenem                      S Gentamicin                     S Imipenem                       S Levofloxacin                   R Nitrofurantoin                 S Piperacillin                   R Tetracycline                   R Tobramycin                     S Trimethoprim/Sulfa             R  Results for orders placed or performed during the hospital encounter of 09/23/15 (from the past 48 hour(s))  Basic metabolic panel     Status: Abnormal   Collection Time: 09/23/15  6:58 PM  Result Value Ref Range   Sodium 130 (L) 135 - 145 mmol/L   Potassium 4.3 3.5 - 5.1 mmol/L   Chloride 95 (L) 101 - 111 mmol/L   CO2 26 22 - 32 mmol/L   Glucose, Bld 158 (H) 65 - 99 mg/dL   BUN 22 (H) 6 - 20 mg/dL   Creatinine, Ser 1.10 (H) 0.44 - 1.00 mg/dL   Calcium 9.3 8.9 - 10.3 mg/dL   GFR calc non Af Amer 43 (L) >60 mL/min   GFR calc Af Amer 49 (L) >60 mL/min    Comment: (NOTE) The eGFR has been calculated using the CKD EPI equation. This calculation has not been validated in all clinical situations. eGFR's persistently <60 mL/min signify  possible Chronic Kidney Disease.    Anion gap 9 5 - 15  CBC     Status: Abnormal   Collection Time: 09/23/15  6:58 PM  Result Value Ref Range   WBC 8.2 3.6 - 11.0 K/uL   RBC 3.99 3.80 - 5.20 MIL/uL   Hemoglobin 13.9 12.0 - 16.0 g/dL   HCT 40.1 35.0 - 47.0 %   MCV 100.5 (H) 80.0 - 100.0 fL   MCH 34.9 (H) 26.0 - 34.0 pg   MCHC 34.7 32.0 - 36.0 g/dL   RDW 12.6 11.5 - 14.5 %   Platelets 188 150 - 440 K/uL  Urinalysis complete, with microscopic- may I&O cath if menses [facility] only)     Status: Abnormal   Collection Time: 09/23/15  6:58 PM  Result Value Ref Range   Color, Urine STRAW (A) YELLOW   APPearance CLEAR (A) CLEAR   Glucose, UA NEGATIVE NEGATIVE mg/dL   Bilirubin Urine NEGATIVE NEGATIVE   Ketones, ur NEGATIVE NEGATIVE mg/dL   Specific Gravity, Urine 1.008 1.005 - 1.030   Hgb urine dipstick NEGATIVE NEGATIVE   pH 6.0 5.0 - 8.0   Protein, ur 30 (A) NEGATIVE mg/dL   Nitrite NEGATIVE NEGATIVE   Leukocytes, UA 1+ (A) NEGATIVE   RBC / HPF 0-5 0 - 5 RBC/hpf   WBC, UA 6-30 0 - 5  WBC/hpf   Bacteria, UA NONE SEEN NONE SEEN   Squamous Epithelial / LPF 0-5 (A) NONE SEEN   Mucous PRESENT   Troponin I     Status: None   Collection Time: 09/23/15  6:58 PM  Result Value Ref Range   Troponin I <0.03 <0.031 ng/mL    Comment:        NO INDICATION OF MYOCARDIAL INJURY.   Glucose, capillary     Status: Abnormal   Collection Time: 09/24/15  1:14 AM  Result Value Ref Range   Glucose-Capillary 160 (H) 65 - 99 mg/dL  CBC     Status: Abnormal   Collection Time: 09/24/15  5:52 AM  Result Value Ref Range   WBC 7.1 3.6 - 11.0 K/uL   RBC 3.68 (L) 3.80 - 5.20 MIL/uL   Hemoglobin 12.6 12.0 - 16.0 g/dL   HCT 36.8 35.0 - 47.0 %   MCV 100.1 (H) 80.0 - 100.0 fL   MCH 34.3 (H) 26.0 - 34.0 pg   MCHC 34.3 32.0 - 36.0 g/dL   RDW 12.5 11.5 - 14.5 %   Platelets 180 150 - 440 K/uL  Creatinine, serum     Status: Abnormal   Collection Time: 09/24/15  5:52 AM  Result Value Ref Range   Creatinine,  Ser 0.92 0.44 - 1.00 mg/dL   GFR calc non Af Amer 53 (L) >60 mL/min   GFR calc Af Amer >60 >60 mL/min    Comment: (NOTE) The eGFR has been calculated using the CKD EPI equation. This calculation has not been validated in all clinical situations. eGFR's persistently <60 mL/min signify possible Chronic Kidney Disease.   Basic metabolic panel     Status: Abnormal   Collection Time: 09/24/15  5:52 AM  Result Value Ref Range   Sodium 135 135 - 145 mmol/L   Potassium 3.6 3.5 - 5.1 mmol/L   Chloride 102 101 - 111 mmol/L   CO2 28 22 - 32 mmol/L   Glucose, Bld 134 (H) 65 - 99 mg/dL   BUN 18 6 - 20 mg/dL   Creatinine, Ser 0.95 0.44 - 1.00 mg/dL   Calcium 8.7 (L) 8.9 - 10.3 mg/dL   GFR calc non Af Amer 51 (L) >60 mL/min   GFR calc Af Amer 59 (L) >60 mL/min    Comment: (NOTE) The eGFR has been calculated using the CKD EPI equation. This calculation has not been validated in all clinical situations. eGFR's persistently <60 mL/min signify possible Chronic Kidney Disease.    Anion gap 5 5 - 15  Hemoglobin A1c     Status: Abnormal   Collection Time: 09/24/15  5:52 AM  Result Value Ref Range   Hgb A1c MFr Bld 6.8 (H) 4.0 - 6.0 %  Glucose, capillary     Status: Abnormal   Collection Time: 09/24/15  7:15 AM  Result Value Ref Range   Glucose-Capillary 145 (H) 65 - 99 mg/dL   Comment 1 Notify RN   Glucose, capillary     Status: Abnormal   Collection Time: 09/24/15 11:10 AM  Result Value Ref Range   Glucose-Capillary 196 (H) 65 - 99 mg/dL   Comment 1 Notify RN   Glucose, capillary     Status: Abnormal   Collection Time: 09/24/15  4:12 PM  Result Value Ref Range   Glucose-Capillary 188 (H) 65 - 99 mg/dL   Comment 1 Notify RN   Glucose, capillary     Status: Abnormal   Collection Time: 09/24/15  9:13 PM  Result Value Ref Range   Glucose-Capillary 155 (H) 65 - 99 mg/dL  Glucose, capillary     Status: Abnormal   Collection Time: 09/25/15  7:46 AM  Result Value Ref Range    Glucose-Capillary 180 (H) 65 - 99 mg/dL   Comment 1 Notify RN   Glucose, capillary     Status: Abnormal   Collection Time: 09/25/15 11:01 AM  Result Value Ref Range   Glucose-Capillary 161 (H) 65 - 99 mg/dL   Comment 1 Notify RN    No components found for: ESR, C REACTIVE PROTEIN MICRO: No results found for this or any previous visit (from the past 720 hour(s)).  IMAGING: Ct Head Wo Contrast  09/23/2015  CLINICAL DATA:  Confusion, urinary frequency. EXAM: CT HEAD WITHOUT CONTRAST TECHNIQUE: Contiguous axial images were obtained from the base of the skull through the vertex without intravenous contrast. COMPARISON:  09/23/2009. FINDINGS: No evidence of an acute infarct, acute hemorrhage, mass lesion, mass effect or hydrocephalus. Atrophy. Confluent low-attenuation in the periventricular deep white matter. Paranasal sinuses are clear. IMPRESSION: 1. No acute intracranial abnormality. 2. Atrophy and chronic microvascular white matter ischemic changes. Electronically Signed   By: Lorin Picket M.D.   On: 09/23/2015 21:21    Assessment:   Joann Wilkinson is a 81 y.o. female  admitted 10/19 with AMS for 1-2 days. She has had urinary frequencey as well and been treated with oral abx as an otpt for recurrent UTI. On admit was afebrile, wbc nml. UA done otpt had >50 WBC and grew >100000 e coli.  Has been + since 9/19- initially treated with cipro 9/19 m then when found R changed to macrobid 9/21 for 7 days. sxs recurrred so 10/2 started cefuroxime x 7 days. Sx recurred again so started on augmentin 10/17.   Per daughter she used to see Dr Bernardo Heater for "bladder issues' and was on detrol.  However has not had long hx of recurrent UTIs. Has no hx kidney stones. She had responded each time to the abx course but once stopped her sxs would recur within 2-3 days. Ua this admit had only 0-5 wbc GFR is 53  Recommendations Check CT renal protocol to look for stone Check PVR Refer as otpt to urology   Change meropenem (not an ESBL E coli) to augmentin 500 bid Can dc on a 2 week course of augmentin and then switch to suppressive macrobid 100 bid for 3 months Fu with urology as otpt  Thank you very much for allowing me to participate in the care of this patient. Please call with questions.   Cheral Marker. Ola Spurr, MD

## 2015-09-25 NOTE — Care Management Important Message (Signed)
Important Message  Patient Details  Name: Joann Wilkinson MRN: [id number] Date of Birth: 1922/08/05   Medicare Important Message Given:  Yes-second notification given    [name] 09/25/2015, 10:18 AM

## 2015-09-25 NOTE — Progress Notes (Signed)
[facility] NAME: Joann Wilkinson    MR#:  [id number]  DATE OF BIRTH:  [birth year]  SUBJECTIVE:  CHIEF COMPLAINT:   Chief Complaint  Patient presents with  . Urinary Tract Infection   the patient is a 81 year old Caucasian female with history of frequent urinary tract infections who presents to the hospital with worsening confusion for one day. He was noted to have pyuria and was admitted to the hospital for meropenem infusion since her prior urinary cultures were growing Escherichia coli, which were multiple antibiotic resistant. She feels a little bit better today and her confusion seemed to be resolving, per her daughter. Also severe constipation, patient complains of some dark stools. Suggested urology evaluation for frequent urinary tract infections. Suggested Senokot Urine cultures from 17th of October 2016 revealed Escherichia coli with multiple resistances, patient is on meropenem. Patient's daughter feels that patient is less confused than it was before.    Review of Systems  Unable to perform ROS: mental status change    VITAL SIGNS: Blood pressure 156/80, pulse 113, temperature 97.5 F (36.4 C), temperature source Oral, resp. rate 18, height '5\' 2"'$  (1.575 m), weight 62.596 kg (138 lb), SpO2 95 %.  PHYSICAL EXAMINATION:   GENERAL:  81 y.o.-year-old patient lying in the bed with no acute distress. Confused. Comfortable, able to communicate and answer questions, follows commands EYES: Pupils equal, round, reactive to light and accommodation. No scleral icterus. Extraocular muscles intact.  HEENT: Head atraumatic, normocephalic. Oropharynx and nasopharynx clear.  NECK:  Supple, no jugular venous distention. No thyroid enlargement, no tenderness.  LUNGS: Normal breath sounds bilaterally, no wheezing, rales,rhonchi or crepitation. No use of accessory muscles of respiration.  CARDIOVASCULAR: S1, S2 normal. No murmurs, rubs, or  gallops.  ABDOMEN: Soft, nontender, nondistended. Bowel sounds present. No organomegaly or mass.  EXTREMITIES: No pedal edema, cyanosis, or clubbing.  NEUROLOGIC: Cranial nerves II through XII are intact. Muscle strength 5/5 in all extremities. Sensation intact. Gait not checked.  PSYCHIATRIC: The patient is alert and oriented x 3.  SKIN: No obvious rash, lesion, or ulcer.   ORDERS/RESULTS REVIEWED:   CBC  Recent Labs Lab 09/23/15 1858 09/24/15 0552  WBC 8.2 7.1  HGB 13.9 12.6  HCT 40.1 36.8  PLT 188 180  MCV 100.5* 100.1*  MCH 34.9* 34.3*  MCHC 34.7 34.3  RDW 12.6 12.5   ------------------------------------------------------------------------------------------------------------------  Chemistries   Recent Labs Lab 09/23/15 1858 09/24/15 0552  NA 130* 135  K 4.3 3.6  CL 95* 102  CO2 26 28  GLUCOSE 158* 134*  BUN 22* 18  CREATININE 1.10* 0.95  0.92  CALCIUM 9.3 8.7*   ------------------------------------------------------------------------------------------------------------------ estimated creatinine clearance is 34.6 mL/min (by C-G formula based on Cr of 0.92). ------------------------------------------------------------------------------------------------------------------ No results for input(s): TSH, T4TOTAL, T3FREE, THYROIDAB in the last 72 hours.  Invalid input(s): FREET3  Cardiac Enzymes  Recent Labs Lab 09/23/15 Williams <0.03   ------------------------------------------------------------------------------------------------------------------ Invalid input(s): POCBNP ---------------------------------------------------------------------------------------------------------------  RADIOLOGY: Ct Head Wo Contrast  09/23/2015  CLINICAL DATA:  Confusion, urinary frequency. EXAM: CT HEAD WITHOUT CONTRAST TECHNIQUE: Contiguous axial images were obtained from the base of the skull through the vertex without intravenous contrast. COMPARISON:   09/23/2009. FINDINGS: No evidence of an acute infarct, acute hemorrhage, mass lesion, mass effect or hydrocephalus. Atrophy. Confluent low-attenuation in the periventricular deep white matter. Paranasal sinuses are clear. IMPRESSION: 1. No acute intracranial abnormality. 2. Atrophy and chronic microvascular white matter ischemic changes.  Electronically Signed   By: Lorin Picket M.D.   On: 09/23/2015 21:21    EKG:  Orders placed or performed during the hospital encounter of 09/23/15  . ED EKG  . ED EKG    ASSESSMENT AND PLAN:  Active Problems:   Encephalopathy   Urinary tract infection  1. Metabolic encephalopathy due to urinary tract infection, improving some. Continue supportive therapy 2. Acute cystitis without hematuria due to multiresistant Escherichia coli, continue meropenem , obtaining infectious disease consult. Suggested urology consultation as outpatient due to recurrent cystitis episodes. Treat constipation, urinary retention.  3. Hyponatremia, resolved with IV fluid administration 4. Renal insufficiency resolved with IV fluid administration  Management plans discussed with the patient, family and they are in agreement.   DRUG ALLERGIES:  Allergies  Allergen Reactions  . Codeine Nausea Only  . Darvon [Propoxyphene] Nausea Only  . Etodolac Nausea Only  . Lisinopril Cough  . Morphine And Related Nausea Only  . Nortriptyline Nausea Only  . Tramadol Nausea Only    CODE STATUS:     Code Status Orders        Start     Ordered   09/23/15 2310  Full code   Continuous     09/23/15 2310    Advance Directive Documentation        Most Recent Value   Type of Advance Directive  Living will, Healthcare Power of Attorney   Pre-existing out of facility DNR order (yellow form or pink MOST form)     "MOST" Form in Place?        TOTAL TIME TAKING CARE OF THIS PATIENT: 35 minutes.    Theodoro Grist M.D on 09/25/2015 at 2:10 PM  Between 7am to 6pm - Pager -  [phone]  After 6pm go to [email or web address] - password [email or web address] [facility]  [facility] [facility]  Office  [phone]  CC: Primary care physician; Marinda Elk, MD

## 2015-09-26 LAB — GLUCOSE, CAPILLARY
GLUCOSE-CAPILLARY: 128 mg/dL — AB (ref 65–99)
Glucose-Capillary: 183 mg/dL — ABNORMAL HIGH (ref 65–99)

## 2015-09-26 MED ORDER — AMOXICILLIN-POT CLAVULANATE 500-125 MG PO TABS: 1.0000 | ORAL_TABLET | Freq: Two times a day (BID) | ORAL | Status: AC

## 2015-09-26 NOTE — Discharge Instructions (Signed)
°  DIET:  Cardiac diet  DISCHARGE CONDITION:  Stable  ACTIVITY:  Activity as tolerated  OXYGEN:  Home Oxygen: No.   Oxygen Delivery: room air  DISCHARGE LOCATION:  Home with Home health Nursing.    If you experience worsening of your admission symptoms, develop shortness of breath, life threatening emergency, suicidal or homicidal thoughts you must seek medical attention immediately by calling 911 or calling your MD immediately  if symptoms less severe.  You Must read complete instructions/literature along with all the possible adverse reactions/side effects for all the Medicines you take and that have been prescribed to you. Take any new Medicines after you have completely understood and accpet all the possible adverse reactions/side effects.   Please note  You were cared for by a hospitalist during your hospital stay. If you have any questions about your discharge medications or the care you received while you were in the hospital after you are discharged, you can call the unit and asked to speak with the hospitalist on call if the hospitalist that took care of you is not available. Once you are discharged, your primary care physician will handle any further medical issues. Please note that NO REFILLS for any discharge medications will be authorized once you are discharged, as it is imperative that you return to your primary care physician (or establish a relationship with a primary care physician if you do not have one) for your aftercare needs so that they can reassess your need for medications and monitor your lab values.

## 2015-09-26 NOTE — Progress Notes (Signed)
Pt BP has been high this shift. Passed on to day shift RN to see if MD would like to order a BID medication for BP since there is no coverage for the evening. Reports feeling better this AM, ambulated to and from BR with steadier gait.

## 2015-09-26 NOTE — Discharge Summary (Signed)
[facility] NAME: Joann Wilkinson    MR#:  [id number]  DATE OF BIRTH:  [birth year]  DATE OF ADMISSION:  09/23/2015 ADMITTING PHYSICIAN: Lytle Butte, MD  DATE OF DISCHARGE: 09/26/2015 12:37 PM  PRIMARY CARE PHYSICIAN: MCLAUGHLIN, MIRIAM K, MD    ADMISSION DIAGNOSIS:  Hyponatremia [E87.1] UTI (lower urinary tract infection) [N39.0] Renal insufficiency [N28.9]  DISCHARGE DIAGNOSIS:  Active Problems:   Encephalopathy   Urinary tract infection   SECONDARY DIAGNOSIS:   Past Medical History  Diagnosis Date  . Diabetes mellitus without complication ([place])   . COPD (chronic obstructive pulmonary disease) ([place])   . Stroke ([place])   . Cancer [facility])     HOSPITAL COURSE:   81 year old female with past medical history of diabetes, COPD, history of previous CVA, who presented to the hospital due to altered mental status and noted to have urinary tract infection.  #1 altered mental status-this was likely metabolic in nature and secondary to the UTI and also combined with mild underlying cognitive decline.  -Patient was treated with IV antibiotics for her UTI and then switched over to oral Augmentin upon discharge. Her mental status has improved since admission but further needs to be followed as an outpatient.  #2 urinary tract infection-patient has had a UTI now for about a month which has been recurrent. -Initially patient was diagnosed in September 19 and placed on oral ciprofloxacin which was then changed to oral Macrobid. She had recurrent symptoms on October 2 and therefore switched to oral cefuroxime for 7 days. She then presented again with urinary tract infection symptoms and started on oral Augmentin. -Patient underwent a CT scan of the abdomen which showed no evidence of nephrolithiasis. She does have a history of bladder spasms and incontinence and is on Detrol at home. -Patient was seen by Dr. Ola Spurr from infectious  disease and he advised discharging her on a long course of oral Augmentin and then suppressive therapy with Macrobid for 3 months and outpatient urology follow-up. -Since patient's clinical symptoms haven't improved and she had no further fever, dysuria she was discharged home on oral antibiotics as mentioned below.  #3 diabetes type 2-patient will continue her glipizide  #4 history of urinary incontinence-patient will continue her Detrol. -Patient will continue outpatient urology follow-up.  Patient is being discharged home with home health nursing services.   DISCHARGE CONDITIONS:   Stable  CONSULTS OBTAINED:  Treatment Team:  Lytle Butte, MD Adrian Prows, MD  DRUG ALLERGIES:   Allergies  Allergen Reactions  . Codeine Nausea Only  . Darvon [Propoxyphene] Nausea Only  . Etodolac Nausea Only  . Lisinopril Cough  . Morphine And Related Nausea Only  . Nortriptyline Nausea Only  . Tramadol Nausea Only    DISCHARGE MEDICATIONS:   Discharge Medication List as of 09/26/2015 11:44 AM    CONTINUE these medications which have CHANGED   Details  amoxicillin-clavulanate (AUGMENTIN) 500-125 MG tablet Take 1 tablet (500 mg total) by mouth 2 (two) times daily., Starting 09/26/2015, Until Fri 10/09/15, Print      CONTINUE these medications which have NOT CHANGED   Details  albuterol (PROVENTIL HFA;VENTOLIN HFA) 108 (90 BASE) MCG/ACT inhaler Inhale 2 puffs into the lungs every 6 (six) hours as needed for wheezing or shortness of breath., Until Discontinued, Historical Med    aspirin EC 81 MG tablet Take 81 mg by mouth daily., Until Discontinued, Historical Med    CALCIUM-MAGNESIUM-VITAMIN D PO Take  2 tablets by mouth at bedtime., Until Discontinued, Historical Med    fluticasone (FLONASE) 50 MCG/ACT nasal spray Place 1 spray into both nostrils at bedtime., Until Discontinued, Historical Med    Fluticasone-Salmeterol (ADVAIR) 250-50 MCG/DOSE AEPB Inhale 1 puff into the lungs 2  (two) times daily., Until Discontinued, Historical Med    furosemide (LASIX) 20 MG tablet Take 20 mg by mouth daily., Until Discontinued, Historical Med    glipiZIDE (GLUCOTROL XL) 2.5 MG 24 hr tablet Take 2.5 mg by mouth daily as needed (for high blood sugar)., Until Discontinued, Historical Med    !! tolterodine (DETROL LA) 2 MG 24 hr capsule Take 2 mg by mouth at bedtime., Until Discontinued, Historical Med    !! tolterodine (DETROL LA) 4 MG 24 hr capsule Take 4 mg by mouth every morning. , Until Discontinued, Historical Med     !! - Potential duplicate medications found. Please discuss with provider.       DISCHARGE INSTRUCTIONS:   DIET:  Diabetic diet  DISCHARGE CONDITION:  Stable  ACTIVITY:  Activity as tolerated  OXYGEN:  Home Oxygen: No.   Oxygen Delivery: room air  DISCHARGE LOCATION:  Home with home health nursing   If you experience worsening of your admission symptoms, develop shortness of breath, life threatening emergency, suicidal or homicidal thoughts you must seek medical attention immediately by calling 911 or calling your MD immediately  if symptoms less severe.  You Must read complete instructions/literature along with all the possible adverse reactions/side effects for all the Medicines you take and that have been prescribed to you. Take any new Medicines after you have completely understood and accpet all the possible adverse reactions/side effects.   Please note  You were cared for by a hospitalist during your hospital stay. If you have any questions about your discharge medications or the care you received while you were in the hospital after you are discharged, you can call the unit and asked to speak with the hospitalist on call if the hospitalist that took care of you is not available. Once you are discharged, your primary care physician will handle any further medical issues. Please note that NO REFILLS for any discharge medications will be  authorized once you are discharged, as it is imperative that you return to your primary care physician (or establish a relationship with a primary care physician if you do not have one) for your aftercare needs so that they can reassess your need for medications and monitor your lab values.     Today   Patient sitting up in chair in no apparent distress. No fever overnight, no other acute complaints presently. Family at bedside.  VITAL SIGNS:  Blood pressure 155/72, pulse 98, temperature 98.9 F (37.2 C), temperature source Oral, resp. rate 16, height '5\' 2"'$  (1.575 m), weight 62.596 kg (138 lb), SpO2 99 %.  I/O:   Intake/Output Summary (Last 24 hours) at 09/26/15 1328 Last data filed at 09/26/15 0743  Gross per 24 hour  Intake    360 ml  Output    850 ml  Net   -490 ml    PHYSICAL EXAMINATION:  GENERAL:  81 y.o.-year-old patient lying in the bed with no acute distress.  EYES: Pupils equal, round, reactive to light and accommodation. No scleral icterus. Extraocular muscles intact.  HEENT: Head atraumatic, normocephalic. Oropharynx and nasopharynx clear.  NECK:  Supple, no jugular venous distention. No thyroid enlargement, no tenderness.  LUNGS: Normal breath sounds bilaterally, no wheezing, rales,rhonchi.  No use of accessory muscles of respiration.  CARDIOVASCULAR: S1, S2 normal. No murmurs, rubs, or gallops.  ABDOMEN: Soft, non-tender, non-distended. Bowel sounds present. No organomegaly or mass.  EXTREMITIES: No pedal edema, cyanosis, or clubbing.  NEUROLOGIC: Cranial nerves II through XII are intact. No focal motor or sensory defecits b/l.  PSYCHIATRIC: The patient is alert and oriented x 3. Good affect.  SKIN: No obvious rash, lesion, or ulcer.   DATA REVIEW:   CBC  Recent Labs Lab 09/24/15 0552  WBC 7.1  HGB 12.6  HCT 36.8  PLT 180    Chemistries   Recent Labs Lab 09/24/15 0552  NA 135  K 3.6  CL 102  CO2 28  GLUCOSE 134*  BUN 18  CREATININE 0.95  0.92   CALCIUM 8.7*    Cardiac Enzymes  Recent Labs Lab 09/23/15 1858  TROPONINI <0.03    Microbiology Results  No results found for this or any previous visit.  RADIOLOGY:  Ct Renal Stone Study  09/25/2015  CLINICAL DATA:  Altered mental status for 1-2 days. Urinary frequency. Urinary tract infection. EXAM: CT ABDOMEN AND PELVIS WITHOUT CONTRAST TECHNIQUE: Multidetector CT imaging of the abdomen and pelvis was performed following the standard protocol without IV contrast. COMPARISON:  03/04/2015 FINDINGS: Lower chest: Reticulonodular interstitial opacities in both lower lobes. Postoperative findings in the right lower hemithorax likely related to removal of the right lower lobe mass shown on the PET-CT from 12/22/2008. Hepatobiliary: Cholecystectomy. Dilated extrahepatic bile duct but similar to 03/04/2015, indistinct margination of the CBD, perhaps 1.1 cm in diameter. Pancreas: Unremarkable Spleen: Unremarkable Adrenals/Urinary Tract: Stable fullness of the left adrenal gland, low-density, likely an adenoma. Mild scarring in the right mid kidney. The no stones or hydronephrosis. Vascular calcifications in both renal hila. No significant abnormal perirenal stranding observed. No wall thickening in the urinary bladder. Stomach/Bowel: Prominent stool throughout the colon favors constipation. Extensive colonic diverticulosis most concentrated in the descending and sigmoid colon without active diverticulitis identified. Appendix not well seen. Vascular/Lymphatic: Aortoiliac atherosclerotic vascular disease. Reproductive: Uterus absent.  Adnexa unremarkable. Other: No supplemental non-categorized findings. Musculoskeletal: Chronic lumbar scoliosis, spondylosis, and degenerative disc disease causing mild to moderate levels of impingement. Chronic mild superior endplate compression at L3. IMPRESSION: 1. No stones or evidence of urinary bladder wall thickening currently. Mild chronic scarring of the right mid  kidney and vascular calcifications in the renal hila. 2.  Prominent stool throughout the colon favors constipation. 3. Colonic diverticulosis is specially in the descending and sigmoid colon. 4.  Aortoiliac atherosclerotic vascular disease. 5. Chronic lumbar scoliosis, spondylosis, and degenerative disc disease. 6. Reticulonodular interstitial opacities in both lower lobes favoring atypical infectious bronchiolitis. Postoperative findings in the right lower lobe. 7. Stable chronic extrahepatic biliary dilatation, possibly a physiologic response to cholecystectomy. Electronically Signed   By: Van Clines M.D.   On: 09/25/2015 16:31      Management plans discussed with the patient, family and they are in agreement.  CODE STATUS:     Code Status Orders        Start     Ordered   09/23/15 2310  Full code   Continuous     09/23/15 2310    Advance Directive Documentation        Most Recent Value   Type of Advance Directive  Living will, Healthcare Power of Attorney   Pre-existing out of facility DNR order (yellow form or pink MOST form)     "MOST" Form in Place?  TOTAL TIME TAKING CARE OF THIS PATIENT: 40 minutes.    Henreitta Leber M.D on 09/26/2015 at 1:28 PM  Between 7am to 6pm - Pager - [phone]  After 6pm go to [email or web address] - password [email or web address] [facility]  [place] [facility]  Office  [phone]  CC: Primary care physician; Marinda Elk, MD

## 2015-09-26 NOTE — Care Management Note (Signed)
Case Management Note  Patient Details  Name: Joann Wilkinson MRN: [id number] Date of Birth: 1922/03/14  Subjective/Objective:    Discussed Discharge planning with Joann Wilkinson, and her son. They chose [facility] to provide Joann Wilkinson home health services, and a referral for home health RN was called and faxed to [facility]. Note on referral to make appointment with son Sweetie Giebler because Joann Wilkinson admits to canceling home health services after a previous hospitalization.              Action/Plan:   Expected Discharge Date:                  Expected Discharge Plan:     In-House Referral:     Discharge planning Services     Post Acute Care Choice:    Choice offered to:     DME Arranged:    DME Agency:     HH Arranged:    [place] Agency:     Status of Service:     Medicare Important Message Given:  Yes-second notification given Date Medicare IM Given:    Medicare IM give by:    Date Additional Medicare IM Given:    Additional Medicare Important Message give by:     If discussed at [place] of Stay Meetings, dates discussed:    Additional Comments:  Ermal Haberer A, RN 09/26/2015, 10:43 AM

## 2015-10-02 ENCOUNTER — Ambulatory Visit: Payer: Self-pay

## 2015-10-02 ENCOUNTER — Encounter: Payer: Self-pay | Admitting: Obstetrics and Gynecology

## 2015-10-02 ENCOUNTER — Ambulatory Visit (INDEPENDENT_AMBULATORY_CARE_PROVIDER_SITE_OTHER): Payer: Medicare Other | Admitting: Obstetrics and Gynecology

## 2015-10-02 VITALS — BP 164/85 | HR 108 | Temp 98.4°F | Resp 16 | Ht 62.0 in | Wt 131.8 lb

## 2015-10-02 DIAGNOSIS — N390 Urinary tract infection, site not specified: Secondary | ICD-10-CM | POA: Diagnosis not present

## 2015-10-02 LAB — URINALYSIS, COMPLETE
Bilirubin, UA: NEGATIVE
GLUCOSE, UA: NEGATIVE
KETONES UA: NEGATIVE
NITRITE UA: NEGATIVE
PH UA: 6.5 (ref 5.0–7.5)
RBC UA: NEGATIVE
SPEC GRAV UA: 1.015 (ref 1.005–1.030)
Urobilinogen, Ur: 0.2 mg/dL (ref 0.2–1.0)

## 2015-10-02 LAB — MICROSCOPIC EXAMINATION: RBC, UA: NONE SEEN /hpf (ref 0–?)

## 2015-10-02 LAB — BLADDER SCAN AMB NON-IMAGING: Scan Result: 110

## 2015-10-02 NOTE — Progress Notes (Signed)
10/02/2015 1:42 PM   Joann Wilkinson August 31, 1922 [id number]  Referring provider: Marinda Elk, MD [facility] [place], [name] [address]  Chief Complaint  Patient presents with  . Establish Care  . Recurrent UTI    HPI: Patient is a 81yo female with a history of recurrent UTIs presenting today for follow up after recent admission for AMS associated UTI. Within the last 2 months is been treated with cefuroxime, Cipro, and Macrobid. She was discharged on Augmentin 14 days. Her daughter reports that she has since returned to her normal cognitive state. Patient denies any urinary complaints today. She specifically denies dysuria or gross hematuria. No recent fevers, nausea, vomiting or flank pain.  Previous patient of Dr. Bernardo Heater  PMH: Past Medical History  Diagnosis Date  . Diabetes mellitus without complication ([place])   . COPD (chronic obstructive pulmonary disease) ([place])   . Stroke ([place])   . Cancer [facility])     Surgical History: Past Surgical History  Procedure Laterality Date  . Tonsillectomy  1930  . Mouth surgery      Cancer   . Appendectomy    . Cholecystectomy    . Hemorrhoid surgery  1957  . Abdominal hysterectomy    . Cataract extraction  2007, 2008  . Lobectomy  2010    Home Medications:    Medication List       This list is accurate as of: 10/02/15  1:42 PM.  Always use your most recent med list.               Acetaminophen 500 MG coapsule  Take by mouth.     albuterol 108 (90 BASE) MCG/ACT inhaler  Commonly known as:  PROVENTIL HFA;VENTOLIN HFA  Inhale 2 puffs into the lungs every 6 (six) hours as needed for wheezing or shortness of breath.     amoxicillin-clavulanate 500-125 MG tablet  Commonly known as:  AUGMENTIN  Take 1 tablet (500 mg total) by mouth 2 (two) times daily.     aspirin EC 81 MG tablet  Take 81 mg by mouth daily.     CALCIUM-MAGNESIUM-VITAMIN D PO  Take 2 tablets by mouth at bedtime.      CELEBREX 200 MG capsule  Generic drug:  celecoxib  Take 200 mg by mouth.     fluticasone 50 MCG/ACT nasal spray  Commonly known as:  FLONASE  Place 1 spray into both nostrils at bedtime.     Fluticasone-Salmeterol 250-50 MCG/DOSE Aepb  Commonly known as:  ADVAIR  Inhale 1 puff into the lungs 2 (two) times daily.     furosemide 20 MG tablet  Commonly known as:  LASIX  Take 20 mg by mouth daily.     glipiZIDE 2.5 MG 24 hr tablet  Commonly known as:  GLUCOTROL XL  Take 2.5 mg by mouth daily as needed (for high blood sugar).     tolterodine 4 MG 24 hr capsule  Commonly known as:  DETROL LA  Take 4 mg by mouth every morning.     tolterodine 2 MG 24 hr capsule  Commonly known as:  DETROL LA  Take 2 mg by mouth at bedtime.        Allergies:  Allergies  Allergen Reactions  . Codeine Nausea Only  . Darvon [Propoxyphene] Nausea Only  . Etodolac Nausea Only  . Lisinopril Cough  . Morphine And Related Nausea Only  . Nortriptyline Nausea Only  . Tramadol Nausea Only  Family History: Family History  Problem Relation Age of Onset  . CAD Mother   . CAD Father     Social History:  reports that she has quit smoking. Her smoking use included Cigarettes. She quit after 34 years of use. She does not have any smokeless tobacco history on file. She reports that she does not drink alcohol or use illicit drugs.  ROS: UROLOGY Frequent Urination?: Yes Hard to postpone urination?: Yes Burning/pain with urination?: No Get up at night to urinate?: Yes Leakage of urine?: Yes Urine stream starts and stops?: No Trouble starting stream?: No Do you have to strain to urinate?: No Blood in urine?: No Urinary tract infection?: Yes Sexually transmitted disease?: No Injury to kidneys or bladder?: No Painful intercourse?: No Weak stream?: No Currently pregnant?: No Vaginal bleeding?: No Last menstrual period?: n  Gastrointestinal Nausea?: No Vomiting?: No Indigestion/heartburn?:  No Diarrhea?: No Constipation?: Yes  Constitutional Fever: No Night sweats?: No Weight loss?: Yes Fatigue?: Yes  Skin Skin rash/lesions?: No Itching?: No  Eyes Blurred vision?: No Double vision?: No  Ears/Nose/Throat Sore throat?: No Sinus problems?: No  Hematologic/Lymphatic Swollen glands?: No Easy bruising?: Yes  Cardiovascular Leg swelling?: No Chest pain?: No  Respiratory Cough?: No Shortness of breath?: No  Endocrine Excessive thirst?: No  Musculoskeletal Back pain?: Yes Joint pain?: Yes  Neurological Headaches?: No Dizziness?: Yes  Psychologic Depression?: No Anxiety?: No  Physical Exam: BP 164/85 mmHg  Pulse 108  Temp(Src) 98.4 F (36.9 C)  Resp 16  Ht '5\' 2"'$  (1.575 m)  Wt 131 lb 12.8 oz (59.784 kg)  BMI 24.10 kg/m2  Constitutional:  Alert and oriented, No acute distress, ambulates well with walker HEENT: [place] AT, moist mucus membranes.  Trachea midline, no masses. Cardiovascular: No clubbing, cyanosis, or edema. Respiratory: Normal respiratory effort, no increased work of breathing. GI: Abdomen is soft, nontender, nondistended, no abdominal masses GU: No CVA tenderness.  Skin: No rashes, bruises or suspicious lesions. Lymph: No cervical or inguinal adenopathy. Neurologic: Grossly intact, no focal deficits, moving all 4 extremities. Psychiatric: Normal mood and affect.  Laboratory Data:   Urinalysis    Component Value Date/Time   COLORURINE STRAW* 09/23/2015 1858   APPEARANCEUR CLEAR* 09/23/2015 1858   LABSPEC 1.008 09/23/2015 1858   PHURINE 6.0 09/23/2015 1858   GLUCOSEU Negative 10/02/2015 0852   HGBUR NEGATIVE 09/23/2015 1858   BILIRUBINUR Negative 10/02/2015 0852   BILIRUBINUR NEGATIVE 09/23/2015 [place] 09/23/2015 1858   PROTEINUR 30* 09/23/2015 1858   NITRITE Negative 10/02/2015 0852   NITRITE NEGATIVE 09/23/2015 1858   LEUKOCYTESUR 1+* 10/02/2015 0852   LEUKOCYTESUR 1+* 09/23/2015 1858     Pertinent Imaging: EXAM: CT ABDOMEN AND PELVIS WITHOUT CONTRAST  TECHNIQUE: Multidetector CT imaging of the abdomen and pelvis was performed following the standard protocol without IV contrast.  COMPARISON: 03/04/2015  FINDINGS: Lower chest: Reticulonodular interstitial opacities in both lower lobes. Postoperative findings in the right lower hemithorax likely related to removal of the right lower lobe mass shown on the PET-CT from 12/22/2008.  Hepatobiliary: Cholecystectomy. Dilated extrahepatic bile duct but similar to 03/04/2015, indistinct margination of the CBD, perhaps 1.1 cm in diameter.  Pancreas: Unremarkable  Spleen: Unremarkable  Adrenals/Urinary Tract: Stable fullness of the left adrenal gland, low-density, likely an adenoma. Mild scarring in the right mid kidney. The no stones or hydronephrosis. Vascular calcifications in both renal hila. No significant abnormal perirenal stranding observed. No wall thickening in the urinary bladder.  Stomach/Bowel: Prominent stool throughout  the colon favors constipation. Extensive colonic diverticulosis most concentrated in the descending and sigmoid colon without active diverticulitis identified. Appendix not well seen.  Vascular/Lymphatic: Aortoiliac atherosclerotic vascular disease.  Reproductive: Uterus absent. Adnexa unremarkable.  Other: No supplemental non-categorized findings.  Musculoskeletal: Chronic lumbar scoliosis, spondylosis, and degenerative disc disease causing mild to moderate levels of impingement. Chronic mild superior endplate compression at L3.  IMPRESSION: 1. No stones or evidence of urinary bladder wall thickening currently. Mild chronic scarring of the right mid kidney and vascular calcifications in the renal hila. 2. Prominent stool throughout the colon favors constipation. 3. Colonic diverticulosis is specially in the descending and sigmoid colon. 4. Aortoiliac  atherosclerotic vascular disease. 5. Chronic lumbar scoliosis, spondylosis, and degenerative disc disease. 6. Reticulonodular interstitial opacities in both lower lobes favoring atypical infectious bronchiolitis. Postoperative findings in the right lower lobe. 7. Stable chronic extrahepatic biliary dilatation, possibly a physiologic response to cholecystectomy.   Electronically Signed  By: Van Clines M.D.  On: 09/25/2015 16:31          Assessment & Plan:    1. Recurrent UTI- Patient status post recent admission for altered mental status associated with urinary tract infection. She was discharged on Augmentin 14 days. She states she feels well today. I will have her follow up what she has finished her Augmentin for a catheterized urine specimen and pelvic exam.UTI prevention strategies discussed.  Good perineal hygiene reviewed. Patient is encouraged to increase daily water intake, start cranberry supplements to prevent invasive colonization along the urinary tract and probiotics, especially lactobacillus to restore normal vaginal flora. - Urinalysis, Complete - BLADDER SCAN AMB NON-IMAGING   Return in about 2 weeks (around 10/16/2015) for Recheck UTI; CATH specimen and pelvic.  These notes generated with voice recognition software. I apologize for typographical errors.  Herbert Moors, [facility] [address], [place], [place] [address] [phone]

## 2015-10-02 NOTE — Patient Instructions (Signed)

## 2015-10-15 ENCOUNTER — Encounter: Payer: Self-pay | Admitting: *Deleted

## 2015-10-16 ENCOUNTER — Encounter: Payer: Self-pay | Admitting: Obstetrics and Gynecology

## 2015-10-16 ENCOUNTER — Ambulatory Visit (INDEPENDENT_AMBULATORY_CARE_PROVIDER_SITE_OTHER): Payer: Medicare Other | Admitting: Obstetrics and Gynecology

## 2015-10-16 VITALS — BP 181/81 | HR 103 | Resp 16 | Ht 62.0 in | Wt 131.1 lb

## 2015-10-16 DIAGNOSIS — B373 Candidiasis of vulva and vagina: Secondary | ICD-10-CM

## 2015-10-16 DIAGNOSIS — B3731 Acute candidiasis of vulva and vagina: Secondary | ICD-10-CM

## 2015-10-16 DIAGNOSIS — N390 Urinary tract infection, site not specified: Secondary | ICD-10-CM

## 2015-10-16 LAB — MICROSCOPIC EXAMINATION

## 2015-10-16 LAB — URINALYSIS, COMPLETE
BILIRUBIN UA: NEGATIVE
Glucose, UA: NEGATIVE
LEUKOCYTES UA: NEGATIVE
Nitrite, UA: NEGATIVE
SPEC GRAV UA: 1.025 (ref 1.005–1.030)
Urobilinogen, Ur: 0.2 mg/dL (ref 0.2–1.0)
pH, UA: 6 (ref 5.0–7.5)

## 2015-10-16 MED ORDER — ESTROGENS, CONJUGATED 0.625 MG/GM VA CREA: TOPICAL_CREAM | VAGINAL | Status: DC

## 2015-10-16 MED ORDER — NYSTATIN 100000 UNIT/GM EX CREA: 1.0000 "application " | TOPICAL_CREAM | Freq: Two times a day (BID) | CUTANEOUS | Status: DC

## 2015-10-16 MED ORDER — FLUCONAZOLE 150 MG PO TABS: 150.0000 mg | ORAL_TABLET | Freq: Once | ORAL | Status: DC

## 2015-10-16 NOTE — Patient Instructions (Signed)
Vaginal yeast infection - Diflucan pill once - Nystatin cream 3 times daily x 1 week  Vaginal Atrophy Vaginal estrogen cream 3 times weekly at bedtime  Urinary tract infection -cranberry supplements twice daily - start daily probiotic - drink more water  Follow up 3 months

## 2015-10-16 NOTE — Progress Notes (Signed)
10/16/2015 12:19 PM   Joann Wilkinson 1922/01/24 [id number]  Referring provider: Marinda Elk, MD [place] [facility] [place], [place] [address]  Chief Complaint  Patient presents with  . Recurrent UTI    HPI: Patient is a 81yo female with a history of recurrent UTIs presenting today for follow up after recent admission for AMS associated UTI. Within the last 2 months is been treated with cefuroxime, Cipro, and Macrobid. She was discharged on Augmentin 14 days. Her daughter reports that she has since returned to her normal cognitive state. Patient denies any urinary complaints today. She specifically denies dysuria or gross hematuria. No recent fevers, nausea, vomiting or flank pain.  She presents today after finishing her Augmentin for a catheterized urine specimen for confirmation of resolution of urinary tract infection. She is complaining of some vaginal irritation which she has been using desonide cream with some improvement.  Previous patient of Dr. Bernardo Heater  PMH: Past Medical History  Diagnosis Date  . Diabetes mellitus without complication ([place])   . COPD (chronic obstructive pulmonary disease) ([place])   . Stroke ([place])   . Cancer [facility])     Surgical History: Past Surgical History  Procedure Laterality Date  . Tonsillectomy  1930  . Mouth surgery      Cancer   . Appendectomy    . Cholecystectomy    . Hemorrhoid surgery  1957  . Abdominal hysterectomy    . Cataract extraction  2007, 2008  . Lobectomy  2010    Home Medications:    Medication List       This list is accurate as of: 10/16/15 12:19 PM.  Always use your most recent med list.               Acetaminophen 500 MG coapsule  Take by mouth.     albuterol 108 (90 BASE) MCG/ACT inhaler  Commonly known as:  PROVENTIL HFA;VENTOLIN HFA  Inhale 2 puffs into the lungs every 6 (six) hours as needed for wheezing or shortness of breath.     aspirin EC 81 MG tablet  Take 81 mg by  mouth daily.     CALCIUM-MAGNESIUM-VITAMIN D PO  Take 2 tablets by mouth at bedtime.     CELEBREX 200 MG capsule  Generic drug:  celecoxib  Take 200 mg by mouth.     fluticasone 50 MCG/ACT nasal spray  Commonly known as:  FLONASE  Place 1 spray into both nostrils at bedtime.     Fluticasone-Salmeterol 250-50 MCG/DOSE Aepb  Commonly known as:  ADVAIR  Inhale 1 puff into the lungs 2 (two) times daily.     glipiZIDE 2.5 MG 24 hr tablet  Commonly known as:  GLUCOTROL XL  Take 2.5 mg by mouth daily as needed (for high blood sugar).        Allergies:  Allergies  Allergen Reactions  . Codeine Nausea Only  . Darvon [Propoxyphene] Nausea Only  . Etodolac Nausea Only  . Lisinopril Cough  . Morphine And Related Nausea Only  . Nortriptyline Nausea Only  . Tramadol Nausea Only    Family History: Family History  Problem Relation Age of Onset  . CAD Mother   . CAD Father     Social History:  reports that she has quit smoking. Her smoking use included Cigarettes. She quit after 34 years of use. She does not have any smokeless tobacco history on file. She reports that she does not drink alcohol or use illicit  drugs.  ROS: UROLOGY Frequent Urination?: Yes Hard to postpone urination?: No Burning/pain with urination?: No Get up at night to urinate?: Yes Leakage of urine?: No Urine stream starts and stops?: No Trouble starting stream?: No Do you have to strain to urinate?: No Blood in urine?: No Urinary tract infection?: No Sexually transmitted disease?: No Injury to kidneys or bladder?: No Painful intercourse?: No Weak stream?: No Currently pregnant?: No Vaginal bleeding?: No Last menstrual period?: n  Gastrointestinal Nausea?: No Vomiting?: No Indigestion/heartburn?: No Diarrhea?: No Constipation?: No  Constitutional Fever: No Night sweats?: No Weight loss?: No Fatigue?: No  Skin Skin rash/lesions?: No Itching?: No  Eyes Blurred vision?: No Double  vision?: No  Ears/Nose/Throat Sore throat?: No Sinus problems?: No  Hematologic/Lymphatic Swollen glands?: No Easy bruising?: Yes  Cardiovascular Leg swelling?: No Chest pain?: No  Respiratory Cough?: No Shortness of breath?: No  Endocrine Excessive thirst?: No  Musculoskeletal Back pain?: Yes Joint pain?: No  Neurological Headaches?: No Dizziness?: No  Psychologic Depression?: No Anxiety?: No  Physical Exam: BP 181/81 mmHg  Pulse 103  Resp 16  Ht '5\' 2"'$  (1.575 m)  Wt 131 lb 1.6 oz (59.467 kg)  BMI 23.97 kg/m2  Constitutional:  Alert and oriented, No acute distress. HEENT: [place] AT, moist mucus membranes.  Trachea midline, no masses. Cardiovascular: No clubbing, cyanosis, or edema. Respiratory: Normal respiratory effort, no increased work of breathing. GI: Abdomen is soft, nontender, nondistended, no abdominal masses GU: No CVA tenderness.  Pelvic exam: severe vaginal atrophy with narrow introitus limiting exam, mucosa pale, irritated and friable, rash noted on external genitalia consistent with yeast dermatitis Skin: No rashes, bruises or suspicious lesions. Lymph: No cervical or inguinal adenopathy. Neurologic: Grossly intact, no focal deficits, moving all 4 extremities. Psychiatric: Normal mood and affect.  Laboratory Data:   Urinalysis    Component Value Date/Time   COLORURINE STRAW* 09/23/2015 1858   APPEARANCEUR CLEAR* 09/23/2015 1858   LABSPEC 1.008 09/23/2015 1858   PHURINE 6.0 09/23/2015 1858   GLUCOSEU Negative 10/02/2015 0852   HGBUR NEGATIVE 09/23/2015 1858   BILIRUBINUR Negative 10/02/2015 [place] 09/23/2015 [place] 09/23/2015 1858   PROTEINUR 30* 09/23/2015 1858   NITRITE Negative 10/02/2015 0852   NITRITE NEGATIVE 09/23/2015 1858   LEUKOCYTESUR 1+* 10/02/2015 0852   LEUKOCYTESUR 1+* 09/23/2015 1858    Pertinent Imaging:   Assessment & Plan:    1. Recurrent UTI- Patient status post recent  admission for altered mental status associated with urinary tract infection. She was discharged on Augmentin 14 days. Since Dr. states that she has finished her Augmentin. Catheterized urine specimen obtained today for confirmation of resolution of previous urinary tract infection. I&O catheter specimen obtained using sterile technique. Sent for culture. Patient encouraged to continue previously discussed UTI prevention. - Urinalysis, Complete - BLADDER SCAN AMB NON-IMAGING  2. Vaginal Atrophy- Significant vaginal atrophy noted on exam today. This is likely contributing to patient's recurrent urinary tract infections. We will start vaginal estrogen replacement therapy and follow-up for recheck in 3 months.Patient was given prescription vaginal estrogen cream and instructed to insert 0.5gm vaginally twice weekly and apply a pea-sized amount just inside the vaginal introitus to urethra with a finger-tip every night for two weeks and then Monday, Wednesday and Friday nights. General risks of HRT reviewed though I explained to the patient that vaginally administered estrogen, which causes only a slight increase in the blood estrogen levels, have fewer contraindications and adverse systemic effects that  oral HT.  3. Yeast Vaginitis- Patient recently completed a long course of oral antibiotic therapy for urinary tract infection. She presents today for pelvic exam and severe vaginal yeast infection was noted.  Will treat with oral Diflucan and topical Nystatin cream.  Return in about 3 months (around 01/16/2016).  These notes generated with voice recognition software. I apologize for typographical errors.  Herbert Moors, [facility] [address], [place], [place] [address] [phone]

## 2015-10-20 LAB — CULTURE, URINE COMPREHENSIVE

## 2015-10-21 ENCOUNTER — Telehealth: Payer: Self-pay

## 2015-10-21 NOTE — Telephone Encounter (Signed)
-----   Message from Roda Shutters, [place] sent at 10/21/2015  9:11 AM EST ----- Please notify patient and her daughter that her urine culture was negative for infection. She did have a small amount of blood in her urine at her last visit. I suspect this is likely from her vaginal irritation. She needs to keep her three-month follow-up appointment and we will recheck her urine at that time. Thanks

## 2015-10-21 NOTE — Telephone Encounter (Signed)
Spoke with Gregary Signs, pt daughter, in reference to ucx and hematuria. Gregary Signs voiced understanding.

## 2016-01-18 ENCOUNTER — Ambulatory Visit: Payer: Medicare Other | Admitting: Obstetrics and Gynecology

## 2016-01-19 ENCOUNTER — Ambulatory Visit: Payer: Medicare Other | Admitting: Obstetrics and Gynecology

## 2016-01-21 ENCOUNTER — Ambulatory Visit (INDEPENDENT_AMBULATORY_CARE_PROVIDER_SITE_OTHER): Payer: Medicare Other | Admitting: Obstetrics and Gynecology

## 2016-01-21 ENCOUNTER — Encounter: Payer: Self-pay | Admitting: Obstetrics and Gynecology

## 2016-01-21 VITALS — BP 189/94 | HR 96 | Resp 16 | Ht 62.0 in | Wt 136.1 lb

## 2016-01-21 DIAGNOSIS — N390 Urinary tract infection, site not specified: Secondary | ICD-10-CM | POA: Diagnosis not present

## 2016-01-21 LAB — URINALYSIS, COMPLETE
Bilirubin, UA: NEGATIVE
Nitrite, UA: NEGATIVE
Specific Gravity, UA: >1.03 — ABNORMAL HIGH (ref 1.005–1.030)
UUROB: 0.2 mg/dL (ref 0.2–1.0)
pH, UA: 6.5 (ref 5.0–7.5)

## 2016-01-21 LAB — MICROSCOPIC EXAMINATION: Epithelial Cells (non renal): >10 /hpf — ABNORMAL HIGH (ref 0–10)

## 2016-01-21 NOTE — Progress Notes (Signed)
2:51 PM   Joann Wilkinson February 06, 1922 [id number]  Referring provider: Marinda Elk, MD [place] [facility] [place] [address]  Chief Complaint  Patient presents with  . Recurrent UTI  . Follow-up    HPI: Patient is a 82yo female with a history of recurrent UTIs presenting today for follow up after recent admission for AMS associated UTI. Within the last 2 months is been treated with cefuroxime, Cipro, and Macrobid. She was discharged on Augmentin 14 days. Her daughter reports that she has since returned to her normal cognitive state. Patient denies any urinary complaints today. She specifically denies dysuria or gross hematuria. No recent fevers, nausea, vomiting or flank pain.  She presents today after finishing her Augmentin for a catheterized urine specimen for confirmation of resolution of urinary tract infection. She is complaining of some vaginal irritation which she has been using desonide cream with some improvement.  Previous patient of Dr. Bernardo Heater  Interval History 01/21/16: Continued urinary frequency but urge incontinence has completely resolved.  She has completed her nystatin cream and reports significant improvement in vaginal symptoms. She has continued to use her vaginal estrogen cream as directed. She denies any other urinary symptoms.  PMH: Past Medical History  Diagnosis Date  . Diabetes mellitus without complication ([place])   . COPD (chronic obstructive pulmonary disease) ([place])   . Stroke ([place])   . Cancer [facility])     Surgical History: Past Surgical History  Procedure Laterality Date  . Tonsillectomy  1930  . Mouth surgery      Cancer   . Appendectomy    . Cholecystectomy    . Hemorrhoid surgery  1957  . Abdominal hysterectomy    . Cataract extraction  2007, 2008  . Lobectomy  2010    Home Medications:    Medication List       This list is accurate as of: 01/21/16  2:51 PM.  Always use your most recent med list.                Acetaminophen 500 MG coapsule  Take by mouth.     albuterol 108 (90 Base) MCG/ACT inhaler  Commonly known as:  PROVENTIL HFA;VENTOLIN HFA  Inhale 2 puffs into the lungs every 6 (six) hours as needed for wheezing or shortness of breath.     aspirin EC 81 MG tablet  Take 81 mg by mouth daily.     CALCIUM 500+D 500-400 MG-UNIT Tabs  Generic drug:  Calcium Carb-Cholecalciferol  Take by mouth.     CALCIUM-MAGNESIUM-VITAMIN D PO  Take 2 tablets by mouth at bedtime.     CELEBREX 200 MG capsule  Generic drug:  celecoxib  Take 200 mg by mouth.     conjugated estrogens vaginal cream  Commonly known as:  PREMARIN  Apply a blueberry sized amount of cream to finger tip and massage into tissue at vaginal opening Mon-Wed-Fri at bedtime     [employer] CRANBERRY 500 MG Caps  Generic drug:  Cranberry  Take by mouth.     fluconazole 150 MG tablet  Commonly known as:  DIFLUCAN  Take 1 tablet (150 mg total) by mouth once.     fluticasone 50 MCG/ACT nasal spray  Commonly known as:  FLONASE  Place 1 spray into both nostrils at bedtime.     Fluticasone-Salmeterol 250-50 MCG/DOSE Aepb  Commonly known as:  ADVAIR  Inhale 1 puff into the lungs 2 (two) times daily.     glipiZIDE  2.5 MG 24 hr tablet  Commonly known as:  GLUCOTROL XL  Take 2.5 mg by mouth daily as needed (for high blood sugar).     nystatin cream  Commonly known as:  MYCOSTATIN  Apply 1 application topically 2 (two) times daily. For 1 week        Allergies:  Allergies  Allergen Reactions  . Codeine Nausea Only  . Darvon [Propoxyphene] Nausea Only  . Etodolac Nausea Only  . Lisinopril Cough  . Morphine And Related Nausea Only  . Nortriptyline Nausea Only  . Tramadol Nausea Only    Family History: Family History  Problem Relation Age of Onset  . CAD Mother   . CAD Father     Social History:  reports that she has quit smoking. Her smoking use included Cigarettes. She quit after 34 years of use. She  does not have any smokeless tobacco history on file. She reports that she does not drink alcohol or use illicit drugs.  ROS: UROLOGY Frequent Urination?: No Hard to postpone urination?: No Burning/pain with urination?: No Get up at night to urinate?: No Leakage of urine?: No Urine stream starts and stops?: No Trouble starting stream?: No Do you have to strain to urinate?: No Blood in urine?: No Urinary tract infection?: No Sexually transmitted disease?: No Injury to kidneys or bladder?: No Painful intercourse?: No Weak stream?: No Currently pregnant?: No Vaginal bleeding?: No Last menstrual period?: n  Gastrointestinal Nausea?: No Vomiting?: No Indigestion/heartburn?: No Diarrhea?: No Constipation?: No  Constitutional Fever: No Night sweats?: No Weight loss?: No Fatigue?: No  Skin Skin rash/lesions?: No Itching?: No  Eyes Blurred vision?: No Double vision?: No  Ears/Nose/Throat Sore throat?: No Sinus problems?: No  Hematologic/Lymphatic Swollen glands?: No Easy bruising?: No  Cardiovascular Leg swelling?: No Chest pain?: No  Respiratory Cough?: No Shortness of breath?: No  Endocrine Excessive thirst?: No  Musculoskeletal Back pain?: No Joint pain?: No  Neurological Headaches?: No Dizziness?: No  Psychologic Depression?: No Anxiety?: No  Physical Exam: There were no vitals taken for this visit.  Constitutional:  Alert and oriented, No acute distress. HEENT: [place] AT, moist mucus membranes.  Trachea midline, no masses. Cardiovascular: No clubbing, cyanosis, or edema. Respiratory: Normal respiratory effort, no increased work of breathing. GI: Abdomen is soft, nontender, nondistended, no abdominal masses GU: No CVA tenderness.  Pelvic exam: severe vaginal atrophy with narrow introitus limiting exam, mucosa pale, irritated and friable, rash noted on external genitalia consistent with yeast dermatitis Skin: No rashes, bruises or suspicious  lesions. Lymph: No cervical or inguinal adenopathy. Neurologic: Grossly intact, no focal deficits, moving all 4 extremities. Psychiatric: Normal mood and affect.  Laboratory Data:   Urinalysis  Pertinent Imaging:   Assessment & Plan:    1. Recurrent UTI- she reports much improvement in urinary symptoms. She continues to experience some frequency but her urge incontinence has resolved. She is experiencing no dysuria or fevers. Her UA today suspicious for vaginal contamination it is nitrite negative with many epithelial cells greater than 30 WBCs and many bacteria. Patient and her daughter declined catheterized urine specimen be obtained today and would like to follow up should take patient becomes symptomatic again. Otherwise she will follow-up with her primary care provider as needed - Urinalysis, Complete - BLADDER SCAN AMB NON-IMAGING  2. Vaginal Atrophy- Patient advised to continue vaginal estrogen replacement cream.  3. Yeast Vaginitis- she reports previous symptoms have resolved. Follow up with PCP and Urology as needed.   No Follow-up  on file.  These notes generated with voice recognition software. I apologize for typographical errors.  Herbert Moors, [facility] [address], [place],  [address] [phone]

## 2018-08-20 ENCOUNTER — Other Ambulatory Visit: Payer: Self-pay

## 2018-08-20 ENCOUNTER — Emergency Department: Payer: Medicare Other

## 2018-08-20 ENCOUNTER — Inpatient Hospital Stay
Admission: EM | Admit: 2018-08-20 | Discharge: 2018-08-24 | DRG: 291 | Disposition: A | Discharge status: Hospice | Payer: Medicare Other | Attending: Internal Medicine | Admitting: Internal Medicine

## 2018-08-20 ENCOUNTER — Encounter: Payer: Self-pay | Admitting: Radiology

## 2018-08-20 DIAGNOSIS — R748 Abnormal levels of other serum enzymes: Secondary | ICD-10-CM | POA: Diagnosis not present

## 2018-08-20 DIAGNOSIS — Z85819 Personal history of malignant neoplasm of unspecified site of lip, oral cavity, and pharynx: Secondary | ICD-10-CM

## 2018-08-20 DIAGNOSIS — J90 Pleural effusion, not elsewhere classified: Secondary | ICD-10-CM | POA: Diagnosis present

## 2018-08-20 DIAGNOSIS — J9601 Acute respiratory failure with hypoxia: Secondary | ICD-10-CM | POA: Diagnosis present

## 2018-08-20 DIAGNOSIS — Z7189 Other specified counseling: Secondary | ICD-10-CM | POA: Diagnosis not present

## 2018-08-20 DIAGNOSIS — Z87891 Personal history of nicotine dependence: Secondary | ICD-10-CM | POA: Diagnosis not present

## 2018-08-20 DIAGNOSIS — R7989 Other specified abnormal findings of blood chemistry: Secondary | ICD-10-CM

## 2018-08-20 DIAGNOSIS — L8990 Pressure ulcer of unspecified site, unspecified stage: Secondary | ICD-10-CM | POA: Diagnosis present

## 2018-08-20 DIAGNOSIS — G894 Chronic pain syndrome: Secondary | ICD-10-CM | POA: Diagnosis not present

## 2018-08-20 DIAGNOSIS — Z885 Allergy status to narcotic agent status: Secondary | ICD-10-CM | POA: Diagnosis not present

## 2018-08-20 DIAGNOSIS — Z85118 Personal history of other malignant neoplasm of bronchus and lung: Secondary | ICD-10-CM | POA: Diagnosis not present

## 2018-08-20 DIAGNOSIS — Z8673 Personal history of transient ischemic attack (TIA), and cerebral infarction without residual deficits: Secondary | ICD-10-CM | POA: Diagnosis not present

## 2018-08-20 DIAGNOSIS — Z8619 Personal history of other infectious and parasitic diseases: Secondary | ICD-10-CM | POA: Diagnosis not present

## 2018-08-20 DIAGNOSIS — I471 Supraventricular tachycardia: Secondary | ICD-10-CM | POA: Diagnosis not present

## 2018-08-20 DIAGNOSIS — E871 Hypo-osmolality and hyponatremia: Secondary | ICD-10-CM | POA: Diagnosis present

## 2018-08-20 DIAGNOSIS — Z66 Do not resuscitate: Secondary | ICD-10-CM | POA: Diagnosis present

## 2018-08-20 DIAGNOSIS — N179 Acute kidney failure, unspecified: Secondary | ICD-10-CM | POA: Diagnosis not present

## 2018-08-20 DIAGNOSIS — J441 Chronic obstructive pulmonary disease with (acute) exacerbation: Secondary | ICD-10-CM

## 2018-08-20 DIAGNOSIS — I2510 Atherosclerotic heart disease of native coronary artery without angina pectoris: Secondary | ICD-10-CM | POA: Diagnosis present

## 2018-08-20 DIAGNOSIS — E785 Hyperlipidemia, unspecified: Secondary | ICD-10-CM | POA: Diagnosis present

## 2018-08-20 DIAGNOSIS — I5043 Acute on chronic combined systolic (congestive) and diastolic (congestive) heart failure: Secondary | ICD-10-CM | POA: Diagnosis present

## 2018-08-20 DIAGNOSIS — R0902 Hypoxemia: Secondary | ICD-10-CM

## 2018-08-20 DIAGNOSIS — I110 Hypertensive heart disease with heart failure: Secondary | ICD-10-CM | POA: Diagnosis present

## 2018-08-20 DIAGNOSIS — Z79899 Other long term (current) drug therapy: Secondary | ICD-10-CM | POA: Diagnosis not present

## 2018-08-20 DIAGNOSIS — I050 Rheumatic mitral stenosis: Secondary | ICD-10-CM | POA: Diagnosis not present

## 2018-08-20 DIAGNOSIS — R32 Unspecified urinary incontinence: Secondary | ICD-10-CM | POA: Diagnosis present

## 2018-08-20 DIAGNOSIS — I5031 Acute diastolic (congestive) heart failure: Secondary | ICD-10-CM

## 2018-08-20 DIAGNOSIS — J449 Chronic obstructive pulmonary disease, unspecified: Secondary | ICD-10-CM | POA: Diagnosis not present

## 2018-08-20 DIAGNOSIS — Z515 Encounter for palliative care: Secondary | ICD-10-CM | POA: Diagnosis present

## 2018-08-20 DIAGNOSIS — R0600 Dyspnea, unspecified: Secondary | ICD-10-CM | POA: Diagnosis not present

## 2018-08-20 DIAGNOSIS — E119 Type 2 diabetes mellitus without complications: Secondary | ICD-10-CM | POA: Diagnosis present

## 2018-08-20 DIAGNOSIS — R778 Other specified abnormalities of plasma proteins: Secondary | ICD-10-CM

## 2018-08-20 DIAGNOSIS — I248 Other forms of acute ischemic heart disease: Secondary | ICD-10-CM | POA: Diagnosis not present

## 2018-08-20 HISTORY — DX: Transient cerebral ischemic attack, unspecified: G45.9

## 2018-08-20 HISTORY — DX: Anemia, unspecified: D64.9

## 2018-08-20 HISTORY — DX: Malignant neoplasm of mouth, unspecified: C06.9

## 2018-08-20 HISTORY — DX: Personal history of other infectious and parasitic diseases: Z86.19

## 2018-08-20 HISTORY — DX: Atherosclerotic heart disease of native coronary artery without angina pectoris: I25.10

## 2018-08-20 HISTORY — DX: Unspecified retinal vascular occlusion: H34.9

## 2018-08-20 HISTORY — DX: Heart failure, unspecified: I50.9

## 2018-08-20 HISTORY — DX: Essential (primary) hypertension: I10

## 2018-08-20 HISTORY — DX: Unspecified asthma, uncomplicated: J45.909

## 2018-08-20 HISTORY — DX: Carpal tunnel syndrome, unspecified upper limb: G56.00

## 2018-08-20 HISTORY — DX: Unspecified osteoarthritis, unspecified site: M19.90

## 2018-08-20 HISTORY — DX: Malignant neoplasm of unspecified part of unspecified bronchus or lung: C34.90

## 2018-08-20 LAB — PROTIME-INR
INR: 0.95
PROTHROMBIN TIME: 12.6 s (ref 11.4–15.2)

## 2018-08-20 LAB — COMPREHENSIVE METABOLIC PANEL
ALK PHOS: 53 U/L (ref 38–126)
ALT: 9 U/L (ref 0–44)
ANION GAP: 10 (ref 5–15)
AST: 22 U/L (ref 15–41)
Albumin: 3.3 g/dL — ABNORMAL LOW (ref 3.5–5.0)
BILIRUBIN TOTAL: 0.7 mg/dL (ref 0.3–1.2)
BUN: 19 mg/dL (ref 8–23)
CALCIUM: 8.6 mg/dL — AB (ref 8.9–10.3)
CO2: 24 mmol/L (ref 22–32)
CREATININE: 1 mg/dL (ref 0.44–1.00)
Chloride: 99 mmol/L (ref 98–111)
GFR calc Af Amer: 54 mL/min — ABNORMAL LOW (ref >60)
GFR, EST NON AFRICAN AMERICAN: 47 mL/min — AB (ref >60)
Glucose, Bld: 166 mg/dL — ABNORMAL HIGH (ref 70–99)
Potassium: 4.4 mmol/L (ref 3.5–5.1)
Sodium: 133 mmol/L — ABNORMAL LOW (ref 135–145)
TOTAL PROTEIN: 6.9 g/dL (ref 6.5–8.1)

## 2018-08-20 LAB — URINALYSIS, COMPLETE (UACMP) WITH MICROSCOPIC
Bacteria, UA: NONE SEEN
Bilirubin Urine: NEGATIVE
Glucose, UA: NEGATIVE mg/dL
Ketones, ur: NEGATIVE mg/dL
Leukocytes, UA: NEGATIVE
Nitrite: NEGATIVE
PH: 5 (ref 5.0–8.0)
Protein, ur: 100 mg/dL — AB
SPECIFIC GRAVITY, URINE: 1.019 (ref 1.005–1.030)

## 2018-08-20 LAB — CBC WITH DIFFERENTIAL/PLATELET
BASOS ABS: 0 10*3/uL (ref 0–0.1)
Basophils Relative: 1 %
EOS ABS: 0.1 10*3/uL (ref 0–0.7)
EOS PCT: 1 %
HCT: 36.8 % (ref 35.0–47.0)
Hemoglobin: 12.6 g/dL (ref 12.0–16.0)
Lymphocytes Relative: 22 %
Lymphs Abs: 1.3 10*3/uL (ref 1.0–3.6)
MCH: 35.9 pg — ABNORMAL HIGH (ref 26.0–34.0)
MCHC: 34.2 g/dL (ref 32.0–36.0)
MCV: 105 fL — AB (ref 80.0–100.0)
Monocytes Absolute: 0.6 10*3/uL (ref 0.2–0.9)
Monocytes Relative: 10 %
Neutro Abs: 3.9 10*3/uL (ref 1.4–6.5)
Neutrophils Relative %: 66 %
PLATELETS: 222 10*3/uL (ref 150–440)
RBC: 3.51 MIL/uL — AB (ref 3.80–5.20)
RDW: 14.4 % (ref 11.5–14.5)
WBC: 5.9 10*3/uL (ref 3.6–11.0)

## 2018-08-20 LAB — MAGNESIUM: MAGNESIUM: 2.1 mg/dL (ref 1.7–2.4)

## 2018-08-20 LAB — LACTIC ACID, PLASMA: Lactic Acid, Venous: 1.2 mmol/L (ref 0.5–1.9)

## 2018-08-20 LAB — TROPONIN I: TROPONIN I: 0.03 ng/mL — AB (ref <0.03)

## 2018-08-20 LAB — LIPASE, BLOOD: Lipase: 17 U/L (ref 11–51)

## 2018-08-20 LAB — GLUCOSE, CAPILLARY
Glucose-Capillary: 167 mg/dL — ABNORMAL HIGH (ref 70–99)
Glucose-Capillary: 150 mg/dL — ABNORMAL HIGH (ref 70–99)

## 2018-08-20 LAB — APTT: APTT: 29 s (ref 24–36)

## 2018-08-20 LAB — PROCALCITONIN

## 2018-08-20 MED ORDER — SODIUM CHLORIDE 0.9% FLUSH
3.0000 mL | Freq: Two times a day (BID) | INTRAVENOUS | Status: DC
  Administered 2018-08-20 – 2018-08-24 (×8): 3 mL via INTRAVENOUS

## 2018-08-20 MED ORDER — INSULIN ASPART 100 UNIT/ML ~~LOC~~ SOLN
0.0000 [IU] | Freq: Every day | SUBCUTANEOUS | Status: DC
  Administered 2018-08-21 – 2018-08-22 (×2): 2 [IU] via SUBCUTANEOUS
  Administered 2018-08-23: 3 [IU] via SUBCUTANEOUS
  Filled 2018-08-20 (×3): qty 1

## 2018-08-20 MED ORDER — IOHEXOL 350 MG/ML SOLN
75.0000 mL | Freq: Once | INTRAVENOUS | Status: AC | PRN
  Administered 2018-08-20: 75 mL via INTRAVENOUS

## 2018-08-20 MED ORDER — MELATONIN 5 MG PO TABS
5.0000 mg | ORAL_TABLET | Freq: Every evening | ORAL | Status: DC | PRN
  Administered 2018-08-20 – 2018-08-23 (×3): 5 mg via ORAL
  Filled 2018-08-20 (×4): qty 1

## 2018-08-20 MED ORDER — MOMETASONE FURO-FORMOTEROL FUM 200-5 MCG/ACT IN AERO
2.0000 | INHALATION_SPRAY | Freq: Two times a day (BID) | RESPIRATORY_TRACT | Status: DC
  Administered 2018-08-20 – 2018-08-24 (×8): 2 via RESPIRATORY_TRACT
  Filled 2018-08-20: qty 8.8

## 2018-08-20 MED ORDER — METHYLPREDNISOLONE SODIUM SUCC 40 MG IJ SOLR
40.0000 mg | Freq: Four times a day (QID) | INTRAMUSCULAR | Status: DC
  Administered 2018-08-20 – 2018-08-22 (×8): 40 mg via INTRAVENOUS
  Filled 2018-08-20 (×8): qty 1

## 2018-08-20 MED ORDER — BISACODYL 5 MG PO TBEC
5.0000 mg | DELAYED_RELEASE_TABLET | Freq: Every day | ORAL | Status: DC | PRN
  Administered 2018-08-22: 5 mg via ORAL
  Filled 2018-08-20: qty 1

## 2018-08-20 MED ORDER — ACETAMINOPHEN 650 MG RE SUPP: 650.0000 mg | Freq: Four times a day (QID) | RECTAL | Status: DC | PRN

## 2018-08-20 MED ORDER — METOPROLOL TARTRATE 5 MG/5ML IV SOLN
5.0000 mg | INTRAVENOUS | Status: DC | PRN
  Administered 2018-08-20: 5 mg via INTRAVENOUS

## 2018-08-20 MED ORDER — GUAIFENESIN-DM 100-10 MG/5ML PO SYRP
5.0000 mL | ORAL_SOLUTION | ORAL | Status: DC | PRN
  Administered 2018-08-20: 5 mL via ORAL
  Filled 2018-08-20 (×3): qty 5

## 2018-08-20 MED ORDER — SODIUM CHLORIDE 0.9% FLUSH: 3.0000 mL | INTRAVENOUS | Status: DC | PRN

## 2018-08-20 MED ORDER — METHYLPREDNISOLONE SODIUM SUCC 125 MG IJ SOLR: 125.0000 mg | Freq: Once | INTRAMUSCULAR | Status: DC

## 2018-08-20 MED ORDER — CARVEDILOL 3.125 MG PO TABS
6.2500 mg | ORAL_TABLET | Freq: Two times a day (BID) | ORAL | Status: DC
  Administered 2018-08-20: 6.25 mg via ORAL
  Filled 2018-08-20: qty 2

## 2018-08-20 MED ORDER — ASPIRIN EC 81 MG PO TBEC
81.0000 mg | DELAYED_RELEASE_TABLET | Freq: Every day | ORAL | Status: DC
  Administered 2018-08-20 – 2018-08-24 (×5): 81 mg via ORAL
  Filled 2018-08-20 (×5): qty 1

## 2018-08-20 MED ORDER — ALBUTEROL SULFATE (2.5 MG/3ML) 0.083% IN NEBU
2.5000 mg | INHALATION_SOLUTION | Freq: Four times a day (QID) | RESPIRATORY_TRACT | Status: DC
  Administered 2018-08-20 – 2018-08-22 (×7): 2.5 mg via RESPIRATORY_TRACT
  Filled 2018-08-20 (×9): qty 3

## 2018-08-20 MED ORDER — FLUTICASONE PROPIONATE 50 MCG/ACT NA SUSP
1.0000 | Freq: Every day | NASAL | Status: DC
  Administered 2018-08-20 – 2018-08-23 (×4): 1 via NASAL
  Filled 2018-08-20: qty 16

## 2018-08-20 MED ORDER — SENNOSIDES-DOCUSATE SODIUM 8.6-50 MG PO TABS
1.0000 | ORAL_TABLET | Freq: Every evening | ORAL | Status: DC | PRN
  Administered 2018-08-22: 20:00:00 1 via ORAL
  Filled 2018-08-20: qty 1

## 2018-08-20 MED ORDER — METOPROLOL TARTRATE 5 MG/5ML IV SOLN
INTRAVENOUS | Status: AC
  Filled 2018-08-20: qty 5

## 2018-08-20 MED ORDER — FUROSEMIDE 10 MG/ML IJ SOLN
20.0000 mg | Freq: Two times a day (BID) | INTRAMUSCULAR | Status: DC
  Administered 2018-08-20 – 2018-08-22 (×4): 20 mg via INTRAVENOUS
  Filled 2018-08-20 (×4): qty 2

## 2018-08-20 MED ORDER — ONDANSETRON HCL 4 MG/2ML IJ SOLN: 4.0000 mg | Freq: Four times a day (QID) | INTRAMUSCULAR | Status: DC | PRN

## 2018-08-20 MED ORDER — ONDANSETRON HCL 4 MG PO TABS: 4.0000 mg | ORAL_TABLET | Freq: Four times a day (QID) | ORAL | Status: DC | PRN

## 2018-08-20 MED ORDER — SODIUM CHLORIDE 0.9 % IV SOLN: 250.0000 mL | INTRAVENOUS | Status: DC | PRN

## 2018-08-20 MED ORDER — ALBUTEROL SULFATE (2.5 MG/3ML) 0.083% IN NEBU: 2.5000 mg | INHALATION_SOLUTION | RESPIRATORY_TRACT | Status: DC | PRN

## 2018-08-20 MED ORDER — INSULIN ASPART 100 UNIT/ML ~~LOC~~ SOLN
0.0000 [IU] | Freq: Three times a day (TID) | SUBCUTANEOUS | Status: DC
  Administered 2018-08-20 – 2018-08-21 (×3): 2 [IU] via SUBCUTANEOUS
  Administered 2018-08-21: 09:00:00 1 [IU] via SUBCUTANEOUS
  Administered 2018-08-22: 13:00:00 3 [IU] via SUBCUTANEOUS
  Administered 2018-08-22: 08:00:00 2 [IU] via SUBCUTANEOUS
  Administered 2018-08-22: 18:00:00 5 [IU] via SUBCUTANEOUS
  Administered 2018-08-23 – 2018-08-24 (×4): 3 [IU] via SUBCUTANEOUS
  Administered 2018-08-24: 17:00:00 5 [IU] via SUBCUTANEOUS
  Administered 2018-08-24: 2 [IU] via SUBCUTANEOUS
  Filled 2018-08-20 (×13): qty 1

## 2018-08-20 MED ORDER — ACETAMINOPHEN 325 MG PO TABS: 650.0000 mg | ORAL_TABLET | Freq: Four times a day (QID) | ORAL | Status: DC | PRN

## 2018-08-20 MED ORDER — PNEUMOCOCCAL VAC POLYVALENT 25 MCG/0.5ML IJ INJ: 0.5000 mL | INJECTION | INTRAMUSCULAR | Status: DC | PRN

## 2018-08-20 MED ORDER — SODIUM CHLORIDE 0.9 % IV BOLUS
1000.0000 mL | Freq: Once | INTRAVENOUS | Status: AC
  Administered 2018-08-20: 1000 mL via INTRAVENOUS

## 2018-08-20 MED ORDER — ENOXAPARIN SODIUM 40 MG/0.4ML ~~LOC~~ SOLN
40.0000 mg | SUBCUTANEOUS | Status: DC
  Administered 2018-08-20 – 2018-08-21 (×2): 40 mg via SUBCUTANEOUS
  Filled 2018-08-20 (×2): qty 0.4

## 2018-08-20 MED ORDER — METOPROLOL TARTRATE 25 MG PO TABS
25.0000 mg | ORAL_TABLET | Freq: Two times a day (BID) | ORAL | Status: DC
  Administered 2018-08-20 – 2018-08-22 (×4): 25 mg via ORAL
  Filled 2018-08-20 (×5): qty 1

## 2018-08-20 MED ORDER — TRAZODONE HCL 50 MG PO TABS
50.0000 mg | ORAL_TABLET | Freq: Every evening | ORAL | Status: DC | PRN
  Administered 2018-08-20 – 2018-08-23 (×3): 50 mg via ORAL
  Filled 2018-08-20 (×3): qty 1

## 2018-08-20 MED ORDER — AMLODIPINE BESYLATE 5 MG PO TABS
5.0000 mg | ORAL_TABLET | Freq: Every day | ORAL | Status: DC
  Administered 2018-08-20 – 2018-08-24 (×5): 5 mg via ORAL
  Filled 2018-08-20 (×5): qty 1

## 2018-08-20 MED ORDER — MAGNESIUM SULFATE 2 GM/50ML IV SOLN
2.0000 g | INTRAVENOUS | Status: AC
  Administered 2018-08-20: 2 g via INTRAVENOUS
  Filled 2018-08-20: qty 50

## 2018-08-20 NOTE — Significant Event (Signed)
Rapid Response Event Note  Overview: Time Called: [place] Time: 5449 Event Type: Cardiac  Initial Focused Assessment:   Interventions:  Plan of Care (if not transferred):  Event Summary: Name of Physician Notified: Chen at 1800    at    Outcome: Stayed in room and stabalized  Event End Time: [place]

## 2018-08-20 NOTE — H&P (Signed)
[place] [facility] [place] NAME: Joann Wilkinson    MR#:  [id number]  DATE OF BIRTH:  21-Jul-1922  DATE OF ADMISSION:  08/20/2018  PRIMARY CARE PHYSICIAN: Marinda Elk, MD   REQUESTING/REFERRING PHYSICIAN: Dr. Janeece Fitting.  CHIEF COMPLAINT:   Chief Complaint  Patient presents with  . Respiratory Distress   Shortness of breath for 2 weeks, worsening several days. HISTORY OF PRESENT ILLNESS:  Joann Wilkinson  is a 84 y.o. female with a known history of COPD, CHF, CAD, hypertension, hyperlipidemia, diabetes, stroke, lung cancer and pneumonia.  The patient has been treated with antibiotics for pneumonia for the past 2 weeks.  He finished his antibiotics but still has worsening shortness of breath for the past several days.  In addition, the patient has had leg swelling for the past 2 weeks.  The patient denies any fever or chills, no chest pain, orthopnea or nocturnal dyspnea.  Chest x-ray show pleural effusion.  CT angiogram of the chest to did not show any PE but bilateral large pleural effusion.  PAST MEDICAL HISTORY:   Past Medical History:  Diagnosis Date  . Cancer ([place])   . COPD (chronic obstructive pulmonary disease) ([place])   . Diabetes mellitus without complication ([place])   . Stroke [facility])     PAST SURGICAL HISTORY:   Past Surgical History:  Procedure Laterality Date  . ABDOMINAL HYSTERECTOMY    . APPENDECTOMY    . CATARACT EXTRACTION  2007, 2008  . CHOLECYSTECTOMY    . [place]  . LOBECTOMY  2010  . MOUTH SURGERY     Cancer   . TONSILLECTOMY  1930    SOCIAL HISTORY:   Social History   Tobacco Use  . Smoking status: Former Smoker    Years: 34.00    Types: Cigarettes  Substance Use Topics  . Alcohol use: No    Alcohol/week: 0.0 standard drinks    FAMILY HISTORY:   Family History  Problem Relation Age of Onset  . CAD Mother   . CAD Father     DRUG ALLERGIES:   Allergies  Allergen Reactions    . Codeine Nausea Only  . Darvon [Propoxyphene] Nausea Only  . Etodolac Nausea Only  . Lisinopril Cough  . Morphine And Related Nausea Only  . Nortriptyline Nausea Only  . Tramadol Nausea Only    REVIEW OF SYSTEMS:   Review of Systems  Constitutional: Positive for malaise/fatigue. Negative for chills and fever.  HENT: Negative for sore throat.   Eyes: Negative for blurred vision and double vision.  Respiratory: Positive for cough, shortness of breath and wheezing. Negative for hemoptysis, sputum production and stridor.   Cardiovascular: Positive for leg swelling. Negative for chest pain, palpitations and orthopnea.  Gastrointestinal: Negative for abdominal pain, blood in stool, diarrhea, melena, nausea and vomiting.  Genitourinary: Negative for dysuria, flank pain and hematuria.  Musculoskeletal: Negative for back pain and joint pain.  Skin: Negative for rash.  Neurological: Negative for dizziness, sensory change, focal weakness, seizures, loss of consciousness, weakness and headaches.  Endo/Heme/Allergies: Negative for polydipsia.  Psychiatric/Behavioral: Negative for depression. The patient is not nervous/anxious.     MEDICATIONS AT HOME:   Prior to Admission medications   Medication Sig Start Date End Date Taking? Authorizing Provider  amLODipine (NORVASC) 5 MG tablet Take 1 tablet by mouth daily. 07/22/18  Yes [provider]  aspirin EC 81 MG tablet Take 81 mg by mouth daily.  Yes [provider]  CALCIUM-MAGNESIUM-VITAMIN D PO Take 2 tablets by mouth at bedtime.   Yes [provider]  Cranberry ([employer] CRANBERRY) 500 MG CAPS Take 1 capsule by mouth daily.    Yes [provider]  fluticasone (FLONASE) 50 MCG/ACT nasal spray Place 1 spray into both nostrils at bedtime.   Yes [provider]  Fluticasone-Salmeterol (ADVAIR) 250-50 MCG/DOSE AEPB Inhale 1 puff into the lungs 2 (two) times daily.   Yes [provider]  albuterol  (PROVENTIL HFA;VENTOLIN HFA) 108 (90 BASE) MCG/ACT inhaler Inhale 2 puffs into the lungs every 6 (six) hours as needed for wheezing or shortness of breath.    [provider]  conjugated estrogens (PREMARIN) vaginal cream Apply a blueberry sized amount of cream to finger tip and massage into tissue at vaginal opening Mon-Wed-Fri at bedtime Patient not taking: Reported on 08/20/2018 10/16/15   Roda Shutters, FNP      VITAL SIGNS:  Blood pressure (!) 162/75, pulse (!) 113, temperature 99.2 F (37.3 C), temperature source Rectal, resp. rate (!) 28, height 5\' 3"  (1.6 m), weight 70.1 kg, SpO2 98 %.  PHYSICAL EXAMINATION:  Physical Exam  GENERAL:  84 y.o.-year-old patient lying in the bed with no acute distress.  EYES: Pupils equal, round, reactive to light and accommodation. No scleral icterus. Extraocular muscles intact.  HEENT: Head atraumatic, normocephalic. Oropharynx and nasopharynx clear.  NECK:  Supple, no jugular venous distention. No thyroid enlargement, no tenderness.  LUNGS: Normal breath sounds bilaterally, bilateral expiratory wheezing, no rales,rhonchi or crepitation. No use of accessory muscles of respiration.  CARDIOVASCULAR: S1, S2 normal. No murmurs, rubs, or gallops.  ABDOMEN: Soft, nontender, nondistended. Bowel sounds present. No organomegaly or mass.  EXTREMITIES: Bilateral leg edema 2+.  No cyanosis, or clubbing.  NEUROLOGIC: Cranial nerves II through XII are intact. Muscle strength 5/5 in all extremities. Sensation intact. Gait not checked.  PSYCHIATRIC: The patient is alert and oriented x 3.  SKIN: No obvious rash, lesion, or ulcer.   LABORATORY PANEL:   CBC Recent Labs  Lab 08/20/18 1213  WBC 5.9  HGB 12.6  HCT 36.8  PLT 222   ------------------------------------------------------------------------------------------------------------------  Chemistries  Recent Labs  Lab 08/20/18 1213  NA 133*  K 4.4  CL 99  CO2 24  GLUCOSE 166*  BUN 19    CREATININE 1.00  CALCIUM 8.6*  AST 22  ALT 9  ALKPHOS 53  BILITOT 0.7   ------------------------------------------------------------------------------------------------------------------  Cardiac Enzymes Recent Labs  Lab 08/20/18 1213  TROPONINI 0.03*   ------------------------------------------------------------------------------------------------------------------  RADIOLOGY:  Ct Angio Chest Pe W And/or Wo Contrast  Result Date: 08/20/2018 CLINICAL DATA:  Respiratory distress.  Low O2 saturation.  Wheezing. EXAM: CT ANGIOGRAPHY CHEST WITH CONTRAST TECHNIQUE: Multidetector CT imaging of the chest was performed using the standard protocol during bolus administration of intravenous contrast. Multiplanar CT image reconstructions and MIPs were obtained to evaluate the vascular anatomy. CONTRAST:  42mL OMNIPAQUE IOHEXOL 350 MG/ML SOLN COMPARISON:  Chest x-ray dated 08/20/2018 and CT scan dated 02/16/2015 FINDINGS: Cardiovascular: There are no pulmonary emboli. Aortic atherosclerosis. Overall heart size is normal. RV LV ratio is normal. Mediastinum/Nodes: No enlarged mediastinal, hilar, or axillary lymph nodes. Thyroid gland, trachea, and esophagus demonstrate no significant findings. Lungs/Pleura: Large bilateral pleural effusions. Compressive atelectasis in both lower lobes. Some of the pleural fluid is loculated on the right. Emphysematous changes in the both lungs but primarily in the right upper lobe. There are 2 irregular small areas of abnormal  soft tissue density in the right upper lobe adjacent to emphysematous blebs. These probably represent inflammatory disease but follow-up is recommended to exclude small mass lesions. Upper Abdomen: No acute abnormality. Extensive atherosclerosis. Dense calcification in the origins of both renal arteries. Musculoskeletal: Old mild compression fracture of T5. No acute bone abnormality. Review of the MIP images confirms the above findings. IMPRESSION: 1.  Large bilateral pleural effusions with compressive atelectasis in the lower lobes. 2. Aortic Atherosclerosis (ICD10-I70.0) and Emphysema (ICD10-J43.9). 3. Two small areas of abnormal density in the right upper lobe, most likely inflammatory. I recommend follow-up chest x-ray in 2 months. 4. No pulmonary emboli. Electronically Signed   By: Lorriane Shire M.D.   On: 08/20/2018 13:57   Dg Chest Port 1 View  Result Date: 08/20/2018 CLINICAL DATA:  Respiratory distress EXAM: PORTABLE CHEST 1 VIEW COMPARISON:  Chest CT February 16, 2015; chest radiograph February 16, 2015 FINDINGS: There is a pleural effusion on the right, apparently partially loculated, with right base atelectasis. There is a minimal left pleural effusion. There is no frank airspace consolidation. Heart is upper normal in size with pulmonary vascularity normal. There is aortic atherosclerosis. No adenopathy. Bones appear osteoporotic. No pneumothorax. No acute fracture demonstrable. IMPRESSION: Pleural effusion on the right, partially loculated, with minimal pleural effusion on the left. Atelectatic change right base. No consolidation. Stable cardiac silhouette. There is aortic atherosclerosis. Bones are osteoporotic. Aortic Atherosclerosis (ICD10-I70.0). Electronically Signed   By: Lowella Grip III M.D.   On: 08/20/2018 12:16      IMPRESSION AND PLAN:   Acute respiratory failure with hypoxia due to Large bilateral pleural effusions and COPD exacerbation. The patient will be admitted to medical floor. Continue oxygen by nasal cannula, IV Solu-Medrol, albuterol every 6 hours, Robitussin as needed.  Possible acute on chronic CHF. Start CHF protocol, Lasix twice daily and echocardiograph.  Cardiology consult.  Hyponatremia.  Follow-up sodium level while on Lasix.  Diabetes.  Start sliding scale.  Hypertension.  Continue home hypertension medication.  All the records are reviewed and case discussed with ED provider. Management plans  discussed with the patient, his caregiver and sister and they are in agreement.  CODE STATUS: DNR.  TOTAL TIME TAKING CARE OF THIS PATIENT: 45 minutes.    Demetrios Loll M.D on 08/20/2018 at 2:18 PM  Between 7am to 6pm - Pager - [phone]  After 6pm go to [email or web address] - Proofreader  [facility]  Office  [phone]  CC: Primary care physician; Marinda Elk, MD   Note: This dictation was prepared with Dragon dictation along with smaller phrase technology. Any transcriptional errors that result from this process are unin

## 2018-08-20 NOTE — Progress Notes (Signed)
   08/20/18 1740  Clinical Encounter Type  Visited With Patient and family together  Visit Type Initial  Referral From Nurse  Consult/Referral To Chaplain  Spiritual Encounters  Spiritual Needs Prayer   [place] responded to a Rapid Response page. The patient was receiving care from the care team as they attempt to stabilize Ms. Joann Wilkinson. I prayed outside of the room and will follow up as needed.

## 2018-08-20 NOTE — ED Triage Notes (Signed)
Pt brought in by [facility] due to respiratory distress since yesterday. States it started to get worse this morning around 0130. EMS arrived to find pt with labored breathing and 89% O2 sats while taking a nebulizer treatment. IV was initiated by EMS and 125 mg Solumedrol given along with an additional Albuterol treatment. Pt c/o chest pain. Recently dx with cold and placed on antibiotics which she has completed. Pt is on 4 lpm via [place] and sats are 99%. Room air sats were 84% upon ER arrival. Bilateral wheezes noted on auscultation. Pt has productive cough.

## 2018-08-20 NOTE — Significant Event (Signed)
Rapid Response Event Note  Overview: Time Called: 1856 Arrival Time: 1750 Event Type: Cardiac  Initial Focused Assessment: Patient alert and oriented, no acute distress, SVT on cardiac monitor. HR sustaining 150-160's  Interventions: Bridgett Larsson, MD notified and returned page after several attempts to reach. This RN spoke with MD, agreed to order IV metoprolol. Patient in SVT, no chest pain, no respiratory distress, BP stable.  Plan of Care (if not transferred): PRN 5 mg Metoprolol IV administered Bedside RN administered PO HR and BP meds and IV lasix This RN stayed at bedside to monitor BP and HR PRN Metoprolol ordered every 4 hours for sustained HR BP remained stable  Event Summary: Name of Physician Notified: Chen at 1800    at    Outcome: Stayed in room and stabalized  Event End Time: [place]

## 2018-08-20 NOTE — ED Notes (Signed)
Date and time results received: 08/20/18 1313  Test: Troponin Critical Value: 0.03 ng/mL  Name of Provider Notified: Dr. Joni Fears

## 2018-08-20 NOTE — ED Provider Notes (Signed)
[facility] Emergency Department Provider Note  ____________________________________________  Time seen: Approximately 11:54 AM  I have reviewed the triage vital signs and the nursing notes.   HISTORY  Chief Complaint Respiratory Distress    HPI Joann Wilkinson is a 84 y.o. female with a history of COPD, diabetes, lung cancer who complains of shortness of breath worsening since yesterday, gradual onset, constant, no aggravating or alleviating factors, severe.  Associated with chest pain described as tightness in the anterior chest which is nonradiating.  No syncope.  Tried her bronchodilators at home without relief.  EMS gave albuterol nebs en route.      Past Medical History:  Diagnosis Date  . Cancer ([place])   . COPD (chronic obstructive pulmonary disease) ([place])   . Diabetes mellitus without complication ([place])   . Stroke [facility])      Patient Active Problem List   Diagnosis Date Noted  . COPD exacerbation ([place]) 08/20/2018  . Encephalopathy 09/23/2015  . Urinary tract infection 09/23/2015  . Asthma without status asthmaticus 07/28/2014  . Congestive heart failure ([place]) 07/28/2014  . CAD in native artery 07/28/2014  . Type 2 diabetes mellitus ([place]) 07/28/2014  . HLD (hyperlipidemia) 07/28/2014  . BP (high blood pressure) 07/28/2014  . Cancer of lung ([place]) 07/28/2014  . 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 Arthritis, degenerative 07/28/2014  . 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 Bladder infection, chronic 01/16/2013  . Mixed incontinence 01/16/2013  . Excessive urination at night 01/16/2013  . FOM (frequency of micturition) 01/16/2013     Past Surgical History:  Procedure Laterality Date  . ABDOMINAL HYSTERECTOMY    . APPENDECTOMY    . CATARACT EXTRACTION  2007, 2008  . CHOLECYSTECTOMY    . [place]  . LOBECTOMY  2010  . MOUTH SURGERY     Cancer   . TONSILLECTOMY  1930     Prior to Admission  medications   Medication Sig Start Date End Date Taking? Authorizing Provider  amLODipine (NORVASC) 5 MG tablet Take 1 tablet by mouth daily. 07/22/18  Yes [provider]  aspirin EC 81 MG tablet Take 81 mg by mouth daily.   Yes [provider]  CALCIUM-MAGNESIUM-VITAMIN D PO Take 2 tablets by mouth at bedtime.   Yes [provider]  Cranberry ([employer] CRANBERRY) 500 MG CAPS Take 1 capsule by mouth daily.    Yes [provider]  fluticasone (FLONASE) 50 MCG/ACT nasal spray Place 1 spray into both nostrils at bedtime.   Yes [provider]  Fluticasone-Salmeterol (ADVAIR) 250-50 MCG/DOSE AEPB Inhale 1 puff into the lungs 2 (two) times daily.   Yes [provider]  albuterol (PROVENTIL HFA;VENTOLIN HFA) 108 (90 BASE) MCG/ACT inhaler Inhale 2 puffs into the lungs every 6 (six) hours as needed for wheezing or shortness of breath.    [provider]  conjugated estrogens (PREMARIN) vaginal cream Apply a blueberry sized amount of cream to finger tip and massage into tissue at vaginal opening Mon-Wed-Fri at bedtime Patient not taking: Reported on 08/20/2018 10/16/15   Roda Shutters, FNP     Allergies Codeine; Darvon [propoxyphene]; Etodolac; Lisinopril; Morphine and related; Nortriptyline; and Tramadol   Family History  Problem Relation Age of Onset  . CAD Mother   . CAD Father     Social History Social History   Tobacco Use  . Smoking status: Former Smoker    Years: 34.00    Types:  Cigarettes  Substance Use Topics  . Alcohol use: No    Alcohol/week: 0.0 standard drinks  . Drug use: No    Review of Systems  Constitutional:   No fever or chills.  ENT:   No sore throat. No rhinorrhea. Cardiovascular:   Positive chest pain as above without syncope. Respiratory:   Positive shortness of breath and nonproductive cough. Gastrointestinal:   Negative for abdominal pain, vomiting and diarrhea.  Musculoskeletal:   Negative for  focal pain or swelling All other systems reviewed and are negative except as documented above in ROS and HPI.  ____________________________________________   PHYSICAL EXAM:  VITAL SIGNS: ED Triage Vitals  Enc Vitals Group     BP      Pulse      Resp      Temp      Temp src      SpO2      Weight      Height      Head Circumference      Peak Flow      Pain Score      Pain Loc      Pain Edu?      Excl. in [place]?     Vital signs reviewed, nursing assessments reviewed.   Constitutional:   Alert and oriented. Non-toxic appearance. Eyes:   Conjunctivae are normal. EOMI. PERRL. ENT      Head:   Normocephalic and atraumatic.      Nose:   No congestion/rhinnorhea.       Mouth/Throat:   MMM, no pharyngeal erythema. No peritonsillar mass.       Neck:   No meningismus. Full ROM. Hematological/Lymphatic/Immunilogical:   No cervical lymphadenopathy. Cardiovascular:   Tachycardia heart rate 140. Symmetric bilateral radial and DP pulses.  No murmurs. Cap refill less than 2 seconds. Respiratory:   Tachypnea with increased work of breathing.  Diffuse expiratory wheezing.  Good air entry in all lung fields except diminished at bilateral bases..  No crackles Gastrointestinal:   Soft and nontender. Non distended. There is no CVA tenderness.  No rebound, rigidity, or guarding.  Musculoskeletal:   Normal range of motion in all extremities. No joint effusions.  No lower extremity tenderness.  No edema. Neurologic:   Normal speech and language.  Motor grossly intact. No acute focal neurologic deficits are appreciated.  Skin:    Skin is warm, dry and intact. No rash noted.  No petechiae, purpura, or bullae.  ____________________________________________    LABS (pertinent positives/negatives) (all labs ordered are listed, but only abnormal results are displayed) Labs Reviewed  COMPREHENSIVE METABOLIC PANEL - Abnormal; Notable for the following components:      Result Value   Sodium 133 (*)     Glucose, Bld 166 (*)    Calcium 8.6 (*)    Albumin 3.3 (*)    GFR calc non Af Amer 47 (*)    GFR calc Af Amer 54 (*)    All other components within normal limits  TROPONIN I - Abnormal; Notable for the following components:   Troponin I 0.03 (*)    All other components within normal limits  CBC WITH DIFFERENTIAL/PLATELET - Abnormal; Notable for the following components:   RBC 3.51 (*)    MCV 105.0 (*)    MCH 35.9 (*)    All other components within normal limits  URINALYSIS, COMPLETE (UACMP) WITH MICROSCOPIC - Abnormal; Notable for the following components:   Color, Urine YELLOW (*)    APPearance  CLEAR (*)    Hgb urine dipstick SMALL (*)    Protein, ur 100 (*)    All other components within normal limits  CULTURE, BLOOD (ROUTINE X 2)  CULTURE, BLOOD (ROUTINE X 2)  URINE CULTURE  LACTIC ACID, PLASMA  LIPASE, BLOOD  PROCALCITONIN  APTT  PROTIME-INR  LACTIC ACID, PLASMA   ____________________________________________   EKG  Interpreted by me Sinus tachycardia rate 111, normal axis and intervals.  Poor R wave progression.  Normal ST segments and T waves.  ____________________________________________    RADIOLOGY  Ct Angio Chest Pe W And/or Wo Contrast  Result Date: 08/20/2018 CLINICAL DATA:  Respiratory distress.  Low O2 saturation.  Wheezing. EXAM: CT ANGIOGRAPHY CHEST WITH CONTRAST TECHNIQUE: Multidetector CT imaging of the chest was performed using the standard protocol during bolus administration of intravenous contrast. Multiplanar CT image reconstructions and MIPs were obtained to evaluate the vascular anatomy. CONTRAST:  10mL OMNIPAQUE IOHEXOL 350 MG/ML SOLN COMPARISON:  Chest x-ray dated 08/20/2018 and CT scan dated 02/16/2015 FINDINGS: Cardiovascular: There are no pulmonary emboli. Aortic atherosclerosis. Overall heart size is normal. RV LV ratio is normal. Mediastinum/Nodes: No enlarged mediastinal, hilar, or axillary lymph nodes. Thyroid gland, trachea, and  esophagus demonstrate no significant findings. Lungs/Pleura: Large bilateral pleural effusions. Compressive atelectasis in both lower lobes. Some of the pleural fluid is loculated on the right. Emphysematous changes in the both lungs but primarily in the right upper lobe. There are 2 irregular small areas of abnormal soft tissue density in the right upper lobe adjacent to emphysematous blebs. These probably represent inflammatory disease but follow-up is recommended to exclude small mass lesions. Upper Abdomen: No acute abnormality. Extensive atherosclerosis. Dense calcification in the origins of both renal arteries. Musculoskeletal: Old mild compression fracture of T5. No acute bone abnormality. Review of the MIP images confirms the above findings. IMPRESSION: 1. Large bilateral pleural effusions with compressive atelectasis in the lower lobes. 2. Aortic Atherosclerosis (ICD10-I70.0) and Emphysema (ICD10-J43.9). 3. Two small areas of abnormal density in the right upper lobe, most likely inflammatory. I recommend follow-up chest x-ray in 2 months. 4. No pulmonary emboli. Electronically Signed   By: Lorriane Shire M.D.   On: 08/20/2018 13:57   Dg Chest Port 1 View  Result Date: 08/20/2018 CLINICAL DATA:  Respiratory distress EXAM: PORTABLE CHEST 1 VIEW COMPARISON:  Chest CT February 16, 2015; chest radiograph February 16, 2015 FINDINGS: There is a pleural effusion on the right, apparently partially loculated, with right base atelectasis. There is a minimal left pleural effusion. There is no frank airspace consolidation. Heart is upper normal in size with pulmonary vascularity normal. There is aortic atherosclerosis. No adenopathy. Bones appear osteoporotic. No pneumothorax. No acute fracture demonstrable. IMPRESSION: Pleural effusion on the right, partially loculated, with minimal pleural effusion on the left. Atelectatic change right base. No consolidation. Stable cardiac silhouette. There is aortic atherosclerosis.  Bones are osteoporotic. Aortic Atherosclerosis (ICD10-I70.0). Electronically Signed   By: Lowella Grip III M.D.   On: 08/20/2018 12:16    ____________________________________________   PROCEDURES Procedures  ____________________________________________  DIFFERENTIAL DIAGNOSIS   Pneumothorax, COPD exacerbation, pulmonary edema, pulmonary embolism.  Pneumonia  CLINICAL IMPRESSION / ASSESSMENT AND PLAN / ED COURSE  Pertinent labs & imaging results that were available during my care of the patient were reviewed by me and considered in my medical decision making (see chart for details).    P/w cp, sob, sinus tachy 150-160. With h/o lung CA and nl E:I ratio on exam, will give  supportive meds with BDs, solumedrol IV, magnesium 2g IV while obtaining labs. Likely needs CTA chest to r/o PE if CXR non diagnositc. Initially afebrile, doubt sepsis on arrival, not code sepsis at this time.  Clinical Course as of Aug 21 1423  Mon Aug 20, 2018  1420 CT angiogram negative for PE.  No radiographic evidence for pneumonia.  Procalcitonin negative.  At this point symptoms appear to be due to pleural effusions and COPD exacerbation causing acute hypoxic respiratory failure.  Case discussed with the hospitalist for admission and further management.   [PS]    Clinical Course User Index [PS] Carrie Mew, MD     ____________________________________________   FINAL CLINICAL IMPRESSION(S) / ED DIAGNOSES    Final diagnoses:  COPD exacerbation ([place])  Acute respiratory failure with hypoxia ([place])  Pleural effusion     ED Discharge Orders    None      Portions of this note were generated with dragon dictation software. Dictation errors may occur despite best attempts at proofreading.    Carrie Mew, MD 08/20/18 [phone]

## 2018-08-20 NOTE — Progress Notes (Signed)
Paged Dr. Bridgett Larsson for HR sustaining 150s. No CP. Asymptomatic. Rest of VSS. Awaiting response

## 2018-08-20 NOTE — Progress Notes (Signed)
Advanced Care Plan.  Purpose of Encounter: CODE STATUS. Parties in Attendance: The patient, his sister, caregiver and me. Patient's Decisional Capacity: Yes. Medical Story: Joann Wilkinson  is a 84 y.o. female with a known history of COPD, CHF, CAD, hypertension, hyperlipidemia, diabetes, stroke, lung cancer and pneumonia.    The patient is being admitted for acute respiratory failure with hypoxia due to bilateral large pleural effusion, COPD exacerbation and possible acute on chronic CHF.  I discussed the patient current condition, prognosis and the CODE STATUS.  The patient stated that he does not want to be resuscitated or intubated. Plan:  Code Status: DNR. Time spent discussing advance care planning: 18 minutes.

## 2018-08-20 NOTE — ED Notes (Signed)
In and out catheter performed with assistance of Brooke, Therapist, sports.

## 2018-08-21 ENCOUNTER — Encounter: Payer: Self-pay | Admitting: Nurse Practitioner

## 2018-08-21 ENCOUNTER — Inpatient Hospital Stay (HOSPITAL_COMMUNITY)
Admit: 2018-08-21 | Discharge: 2018-08-21 | Disposition: A | Discharge status: Home / Self Care | Payer: Medicare Other | Attending: Internal Medicine | Admitting: Internal Medicine

## 2018-08-21 DIAGNOSIS — R0600 Dyspnea, unspecified: Secondary | ICD-10-CM

## 2018-08-21 DIAGNOSIS — I5031 Acute diastolic (congestive) heart failure: Secondary | ICD-10-CM

## 2018-08-21 DIAGNOSIS — I050 Rheumatic mitral stenosis: Secondary | ICD-10-CM

## 2018-08-21 DIAGNOSIS — L8990 Pressure ulcer of unspecified site, unspecified stage: Secondary | ICD-10-CM

## 2018-08-21 DIAGNOSIS — I248 Other forms of acute ischemic heart disease: Secondary | ICD-10-CM

## 2018-08-21 LAB — BLOOD CULTURE ID PANEL (REFLEXED)
Acinetobacter baumannii: NOT DETECTED
CANDIDA PARAPSILOSIS: NOT DETECTED
Candida albicans: NOT DETECTED
Candida glabrata: NOT DETECTED
Candida krusei: NOT DETECTED
Candida tropicalis: NOT DETECTED
ESCHERICHIA COLI: NOT DETECTED
Enterobacter cloacae complex: NOT DETECTED
Enterobacteriaceae species: NOT DETECTED
Enterococcus species: NOT DETECTED
Haemophilus influenzae: NOT DETECTED
KLEBSIELLA OXYTOCA: NOT DETECTED
KLEBSIELLA PNEUMONIAE: NOT DETECTED
Listeria monocytogenes: NOT DETECTED
Methicillin resistance: NOT DETECTED
Neisseria meningitidis: NOT DETECTED
PROTEUS SPECIES: NOT DETECTED
Pseudomonas aeruginosa: NOT DETECTED
SERRATIA MARCESCENS: NOT DETECTED
STAPHYLOCOCCUS AUREUS BCID: NOT DETECTED
STAPHYLOCOCCUS SPECIES: DETECTED — AB
Streptococcus agalactiae: NOT DETECTED
Streptococcus pneumoniae: NOT DETECTED
Streptococcus pyogenes: NOT DETECTED
Streptococcus species: NOT DETECTED

## 2018-08-21 LAB — GLUCOSE, CAPILLARY
GLUCOSE-CAPILLARY: 144 mg/dL — AB (ref 70–99)
GLUCOSE-CAPILLARY: 187 mg/dL — AB (ref 70–99)
Glucose-Capillary: 198 mg/dL — ABNORMAL HIGH (ref 70–99)
Glucose-Capillary: 247 mg/dL — ABNORMAL HIGH (ref 70–99)

## 2018-08-21 LAB — URINE CULTURE: Culture: NO GROWTH

## 2018-08-21 LAB — BASIC METABOLIC PANEL
ANION GAP: 6 (ref 5–15)
BUN: 22 mg/dL (ref 8–23)
CO2: 25 mmol/L (ref 22–32)
Calcium: 8.3 mg/dL — ABNORMAL LOW (ref 8.9–10.3)
Chloride: 102 mmol/L (ref 98–111)
Creatinine, Ser: 1.05 mg/dL — ABNORMAL HIGH (ref 0.44–1.00)
GFR calc Af Amer: 51 mL/min — ABNORMAL LOW (ref >60)
GFR calc non Af Amer: 44 mL/min — ABNORMAL LOW (ref >60)
GLUCOSE: 168 mg/dL — AB (ref 70–99)
POTASSIUM: 4.5 mmol/L (ref 3.5–5.1)
Sodium: 133 mmol/L — ABNORMAL LOW (ref 135–145)

## 2018-08-21 LAB — CBC
HEMATOCRIT: 34.6 % — AB (ref 35.0–47.0)
HEMOGLOBIN: 11.8 g/dL — AB (ref 12.0–16.0)
MCH: 35.8 pg — AB (ref 26.0–34.0)
MCHC: 34.2 g/dL (ref 32.0–36.0)
MCV: 104.6 fL — AB (ref 80.0–100.0)
Platelets: 207 10*3/uL (ref 150–440)
RBC: 3.31 MIL/uL — ABNORMAL LOW (ref 3.80–5.20)
RDW: 14.3 % (ref 11.5–14.5)
WBC: 2.7 10*3/uL — ABNORMAL LOW (ref 3.6–11.0)

## 2018-08-21 LAB — MRSA PCR SCREENING: MRSA by PCR: NEGATIVE

## 2018-08-21 LAB — TROPONIN I: Troponin I: 0.1 ng/mL (ref <0.03)

## 2018-08-21 LAB — ECHOCARDIOGRAM COMPLETE
Height: 63 in
Weight: 2387.2 oz

## 2018-08-21 MED ORDER — VANCOMYCIN HCL IN DEXTROSE 1-5 GM/200ML-% IV SOLN
1000.0000 mg | Freq: Once | INTRAVENOUS | Status: DC
  Administered 2018-08-21: 1000 mg via INTRAVENOUS
  Filled 2018-08-21: qty 200

## 2018-08-21 NOTE — Progress Notes (Signed)
PHARMACY - PHYSICIAN COMMUNICATION CRITICAL VALUE ALERT - BLOOD CULTURE IDENTIFICATION (BCID)  Joann Wilkinson is an 84 y.o. female who presented to [facility] on 08/20/2018 with a chief complaint of   Assessment:   (include suspected source if known)  Name of physician (or Provider) Contacted: Sridharan  Current antibiotics: none  Changes to prescribed antibiotics recommended:  n/a  Results for orders placed or performed during the hospital encounter of 08/20/18  Blood Culture ID Panel (Reflexed) (Collected: 08/20/2018 12:13 PM)  Result Value Ref Range   Enterococcus species NOT DETECTED NOT DETECTED   Listeria monocytogenes NOT DETECTED NOT DETECTED   Staphylococcus species DETECTED (A) NOT DETECTED   Staphylococcus aureus NOT DETECTED NOT DETECTED   Methicillin resistance NOT DETECTED NOT DETECTED   Streptococcus species NOT DETECTED NOT DETECTED   Streptococcus agalactiae NOT DETECTED NOT DETECTED   Streptococcus pneumoniae NOT DETECTED NOT DETECTED   Streptococcus pyogenes NOT DETECTED NOT DETECTED   Acinetobacter baumannii NOT DETECTED NOT DETECTED   Enterobacteriaceae species NOT DETECTED NOT DETECTED   Enterobacter cloacae complex NOT DETECTED NOT DETECTED   Escherichia coli NOT DETECTED NOT DETECTED   Klebsiella oxytoca NOT DETECTED NOT DETECTED   Klebsiella pneumoniae NOT DETECTED NOT DETECTED   Proteus species NOT DETECTED NOT DETECTED   Serratia marcescens NOT DETECTED NOT DETECTED   Haemophilus influenzae NOT DETECTED NOT DETECTED   Neisseria meningitidis NOT DETECTED NOT DETECTED   Pseudomonas aeruginosa NOT DETECTED NOT DETECTED   Candida albicans NOT DETECTED NOT DETECTED   Candida glabrata NOT DETECTED NOT DETECTED   Candida krusei NOT DETECTED NOT DETECTED   Candida parapsilosis NOT DETECTED NOT DETECTED   Candida tropicalis NOT DETECTED NOT DETECTED    Tyshae Stair S 08/21/2018  4:03 AM

## 2018-08-21 NOTE — Progress Notes (Signed)
*  PRELIMINARY RESULTS* Echocardiogram 2D Echocardiogram has been performed.  Joann Wilkinson 08/21/2018, 10:32 AM

## 2018-08-21 NOTE — Consult Note (Signed)
Cardiology Consult    Patient ID: Joann Wilkinson MRN: [id number], DOB/AGE: 06-22-1922   Admit date: 08/20/2018 Date of Consult: 08/21/2018  Primary Physician: Marinda Elk, MD Primary Cardiologist: Nelva Bush, MD - new Requesting Provider: Doylene Canning  Patient Profile    Joann Wilkinson is a 84 y.o. female with a history of anemia, asthma, COPD, remote tobacco abuse, diabetes, hypertension, stroke, and lung and oral cancers, who is being seen today for the evaluation of dyspnea and heart failure at the request of Dr. Maxie Barb.  Past Medical History   Past Medical History:  Diagnosis Date  . Anemia   . Asthma   . CAD (coronary artery disease)   . Carpal tunnel syndrome   . CHF (congestive heart failure) ([place])   . COPD (chronic obstructive pulmonary disease) ([place])   . Diabetes mellitus without complication ([place])   . Essential hypertension   . History of shingles   . Lung cancer ([place])   . Oral cancer ([place])   . Osteoarthritis   . Retinal artery occlusion   . Stroke ([place])   . TIA (transient ischemic attack)     Past Surgical History:  Procedure Laterality Date  . ABDOMINAL HYSTERECTOMY    . APPENDECTOMY    . CATARACT EXTRACTION  2007, 2008  . CHOLECYSTECTOMY    . [place]  . LOBECTOMY  2010  . MOUTH SURGERY     Cancer   . TONSILLECTOMY  1930     Allergies  Allergies  Allergen Reactions  . Codeine Nausea Only  . Darvon [Propoxyphene] Nausea Only  . Etodolac Nausea Only  . Lisinopril Cough  . Morphine And Related Nausea Only  . Nortriptyline Nausea Only  . Tramadol Nausea Only    History of Present Illness    84 year old female with the above complex past medical history including anemia, asthma, remote tobacco abuse, COPD, diabetes, hypertension, lung cancer, TIA, and stroke.  Primary care records indicate prior history of CAD and heart failure, the patient denies any prior cardiac history.  She has never been seen by  cardiology and has never undergone a formal cardiac evaluation to her knowledge.  She lives locally with a Network engineer.  She ambulates with a walker and generally does pretty well.  She was in her usual state of health until approximately 3 weeks ago, when she began to experience dyspnea.  She was seen by primary care and diagnosed with pneumonia and was placed on a Z-Pak.  She initially had some but minimal improvement and her daughter called her primary care doctor and she was subsequently placed on amoxicillin.  She completed the course of amoxicillin but continued to have dyspnea and weakness.  She occasionally notes lower extremity swelling, especially when sitting for long periods, and this has not changed much.  On September 16, she reported to her nursing assistant that she was having chest discomfort in that setting, she was taken to the emergency department.  Chest pain had more or less resolved prior to arrival.  Here, she was noted to have a minimal troponin elevation of 0.03.  ECG was nonacute.  A CT of her chest showed large bilateral pleural effusions with compressive atelectasis in the lower lobes along with an abnormal density in the right upper lobe which was felt to most likely represent inflammation.  There is no PE.  She was admitted for further evaluation and management.  Following admission, she was noted to have frequent runs  of atrial tachycardia with rates increasing into the 140s.  The longest run occurred between 1720 and 1845 last night.  Since then, she has had multiple brief runs.  She denies experiencing any palpitations, lightheadedness, or recurrent chest pain.  She has been placed on diuretics and has so far responded well.  Breathing is slightly better than yesterday.  Inpatient Medications    . albuterol  2.5 mg Nebulization Q6H  . amLODipine  5 mg Oral Daily  . aspirin EC  81 mg Oral Daily  . enoxaparin (LOVENOX) injection  40 mg Subcutaneous Q24H  . fluticasone   1 spray Each Nare QHS  . furosemide  20 mg Intravenous Q12H  . insulin aspart  0-5 Units Subcutaneous QHS  . insulin aspart  0-9 Units Subcutaneous TID WC  . methylPREDNISolone (SOLU-MEDROL) injection  125 mg Intravenous Once  . methylPREDNISolone (SOLU-MEDROL) injection  40 mg Intravenous Q6H  . metoprolol tartrate  25 mg Oral BID  . mometasone-formoterol  2 puff Inhalation BID  . sodium chloride flush  3 mL Intravenous Q12H    Family History    Family History  Problem Relation Age of Onset  . CAD Mother   . CAD Father    She indicated that her mother is deceased. She indicated that her father is deceased.   Social History    Social History   Socioeconomic History  . Marital status: Widowed    Spouse name: Not on file  . Number of children: Not on file  . Years of education: Not on file  . Highest education level: Not on file  Occupational History  . Not on file  Social Needs  . Financial resource strain: Not on file  . Food insecurity:    Worry: Not on file    Inability: Not on file  . Transportation needs:    Medical: Not on file    Non-medical: Not on file  Tobacco Use  . Smoking status: Former Smoker    Years: 34.00    Types: Cigarettes  Substance and Sexual Activity  . Alcohol use: No    Alcohol/week: 0.0 standard drinks  . Drug use: No  . Sexual activity: Not on file  Lifestyle  . Physical activity:    Days per week: Not on file    Minutes per session: Not on file  . Stress: Not on file  Relationships  . Social connections:    Talks on phone: Not on file    Gets together: Not on file    Attends religious service: Not on file    Active member of club or organization: Not on file    Attends meetings of clubs or organizations: Not on file    Relationship status: Not on file  . Intimate partner violence:    Fear of current or ex partner: Not on file    Emotionally abused: Not on file    Physically abused: Not on file    Forced sexual activity: Not  on file  Other Topics Concern  . Not on file  Social History Narrative   Lives locally in her own home.  Has around the clock assistants that stay with her.  Family nearby.  Does not routinely exercise.  Ambulates w/ walker.     Review of Systems    General:  No chills, fever, night sweats or weight changes.  Cardiovascular:  +++ chest pain prior to admission, +++ dyspnea on exertion x 3+ wks, +++ dependent edema, +++ orthopnea,  no palpitations, paroxysmal nocturnal dyspnea. Dermatological: No rash, lesions/masses Respiratory: No cough, +++ dyspnea Urologic: No hematuria, dysuria Abdominal:   No nausea, vomiting, diarrhea, bright red blood per rectum, melena, or hematemesis Neurologic:  No visual changes, wkns, changes in mental status. All other systems reviewed and are otherwise negative except as noted above.  Physical Exam    Blood pressure (!) 141/86, pulse 92, temperature 97.7 F (36.5 C), temperature source Oral, resp. rate 20, height 5\' 3"  (1.6 m), weight 67.7 kg, SpO2 96 %.  General: Pleasant, NAD Psych: Normal affect. Neuro: Alert and oriented X 3. Moves all extremities spontaneously. HEENT: Normal  Neck: Supple without bruits or JVD. Lungs:  Resp regular and unlabored, markedly diminished breath sounds all the way up bilaterally.  Faint wheezing at the apices bilaterally. Heart: RRR, distant no s3, s4, or murmurs. Abdomen: Soft, non-tender, non-distended, BS + x 4.  Extremities: No clubbing, cyanosis.  Trace to 1+ bilateral lower extremity edema. DP/PT/Radials 2+ and equal bilaterally.  Labs     Recent Labs    08/20/18 1213  TROPONINI 0.03*   Lab Results  Component Value Date   WBC 2.7 (L) 08/21/2018   HGB 11.8 (L) 08/21/2018   HCT 34.6 (L) 08/21/2018   MCV 104.6 (H) 08/21/2018   PLT 207 08/21/2018    Recent Labs  Lab 08/20/18 1213 08/21/18 0511  NA 133* 133*  K 4.4 4.5  CL 99 102  CO2 24 25  BUN 19 22  CREATININE 1.00 1.05*  CALCIUM 8.6* 8.3*    PROT 6.9  --   BILITOT 0.7  --   ALKPHOS 53  --   ALT 9  --   AST 22  --   GLUCOSE 166* 168*    Radiology Studies    Ct Angio Chest Pe W And/or Wo Contrast  Result Date: 08/20/2018 CLINICAL DATA:  Respiratory distress.  Low O2 saturation.  Wheezing. EXAM: CT ANGIOGRAPHY CHEST WITH CONTRAST TECHNIQUE: Multidetector CT imaging of the chest was performed using the standard protocol during bolus administration of intravenous contrast. Multiplanar CT image reconstructions and MIPs were obtained to evaluate the vascular anatomy. CONTRAST:  48mL OMNIPAQUE IOHEXOL 350 MG/ML SOLN COMPARISON:  Chest x-ray dated 08/20/2018 and CT scan dated 02/16/2015 FINDINGS: Cardiovascular: There are no pulmonary emboli. Aortic atherosclerosis. Overall heart size is normal. RV LV ratio is normal. Mediastinum/Nodes: No enlarged mediastinal, hilar, or axillary lymph nodes. Thyroid gland, trachea, and esophagus demonstrate no significant findings. Lungs/Pleura: Large bilateral pleural effusions. Compressive atelectasis in both lower lobes. Some of the pleural fluid is loculated on the right. Emphysematous changes in the both lungs but primarily in the right upper lobe. There are 2 irregular small areas of abnormal soft tissue density in the right upper lobe adjacent to emphysematous blebs. These probably represent inflammatory disease but follow-up is recommended to exclude small mass lesions. Upper Abdomen: No acute abnormality. Extensive atherosclerosis. Dense calcification in the origins of both renal arteries. Musculoskeletal: Old mild compression fracture of T5. No acute bone abnormality. Review of the MIP images confirms the above findings. IMPRESSION: 1. Large bilateral pleural effusions with compressive atelectasis in the lower lobes. 2. Aortic Atherosclerosis (ICD10-I70.0) and Emphysema (ICD10-J43.9). 3. Two small areas of abnormal density in the right upper lobe, most likely inflammatory. I recommend follow-up chest  x-ray in 2 months. 4. No pulmonary emboli. Electronically Signed   By: Lorriane Shire M.D.   On: 08/20/2018 13:57   Dg Chest Port 1 View  Result Date:  08/20/2018 CLINICAL DATA:  Respiratory distress EXAM: PORTABLE CHEST 1 VIEW COMPARISON:  Chest CT February 16, 2015; chest radiograph February 16, 2015 FINDINGS: There is a pleural effusion on the right, apparently partially loculated, with right base atelectasis. There is a minimal left pleural effusion. There is no frank airspace consolidation. Heart is upper normal in size with pulmonary vascularity normal. There is aortic atherosclerosis. No adenopathy. Bones appear osteoporotic. No pneumothorax. No acute fracture demonstrable. IMPRESSION: Pleural effusion on the right, partially loculated, with minimal pleural effusion on the left. Atelectatic change right base. No consolidation. Stable cardiac silhouette. There is aortic atherosclerosis. Bones are osteoporotic. Aortic Atherosclerosis (ICD10-I70.0). Electronically Signed   By: Lowella Grip III M.D.   On: 08/20/2018 12:16    ECG & Cardiac Imaging    Sinus tachycardia, 111, left axis deviation, prior anteroseptal infarct, inferolateral ST depression.  2D Echocardiogram 9.17.2019  Study Conclusions   - Procedure narrative: Transthoracic echocardiography. The study   was technically difficult, as a result of poor acoustic windows.   Apical images could not be obtained. - Left ventricle: The cavity size was normal. Wall thickness was   increased in a pattern of mild LVH. Systolic function was mildly   reduced. The estimated ejection fraction was in the range of 45%   to 50%. The study is not technically sufficient to allow   evaluation of LV diastolic function. - Mitral valve: Calcified annulus. Thickening and calcification of   the anterior leaflet. Leaflet separation was reduced. Mitral   stenosis cannot be excluded. Consider transesophageal   echocardiogram if there is clinical concern for  mitral stenosis. - Pericardium, extracardiac: A trivial pericardial effusion was   identified. There was a left pleural effusion. _____________   Assessment & Plan    1.  Acute respiratory failure/bilateral pleural effusions: Patient presented to [facility] in September 16 with a 3+ week history of dyspnea which was initially treated as pneumonia with antibiotics without significant improvement.  She also had chest pain on the morning of admission.  Chest CT showed no PE but did show large bilateral pleural effusions.  Troponin was minimally elevated.  Echocardiogram was performed this morning and showed mild LV dysfunction with an EF of 45 to 50%.  Reduced mitral valve leaflet separation was also noted, possibly consistent with stenosis.  She is responding to IV diuretics and thus far is -800 mL's.  Breathing has improved.  Creatinine is up slightly this morning.  She does have mild lower extremity swelling on exam the most notably, breath sounds are very diminished.  Recommend follow-up chest x-ray in the a.m. with a low threshold to perform therapeutic thoracentesis if dyspnea persists.  2.  Paroxysmal atrial tachycardia: Patient has been having runs of atrial tachycardia, which are apparently asymptomatic.  Electrolytes okay.  Follow-up TSH.  Beta-blocker added.  Follow and titrate as necessary.  If wheezing persists, would switch to bisoprolol.  3.  Essential hypertension: Blood pressure mildly elevated this morning.  Trend throughout the day with addition of beta-blocker.  Continue amlodipine and Lasix.  4.  Elevated troponin: Minimal elevation in the setting of #1.  Echo with EF of 45 to 50%.  She has not been having any chest pain.  We will follow-up troponin this afternoon.  Could consider ischemic evaluation but would need to discuss with Joann Wilkinson and her family as to whether or not that is appropriate.  Continue aspirin and beta-blocker.  Signed, Murray Hodgkins, NP 08/21/2018,  12:51 PM  For questions or updates, please contact   Please consult [email or web address] for contact info under Cardiology/STEMI.

## 2018-08-21 NOTE — Progress Notes (Signed)
Family Meeting Note  Advance Directive:yes  Today a meeting took place with the daughters.  Patient is unable to participate due [id number] capacity dementia   The following clinical team members were present during this meeting:MD  The following were discussed:Patient's diagnosis: Pleural effusion, acute on chronic diastolic congestive heart failure, Patient's progosis: Unable to determine and Goals for treatment: DNR  Discussed with patient's daughter, she does not want patient to have drainage of the pleural effusion as that was very painful in the past when patient had it.  She wants to meet with the palliative care team as she feels her mother is 83 year old and if this thing happens again she would not like to bring her back to the hospital but just to keep her comfortable.  Additional follow-up to be provided: Palliative care  Time spent during discussion:20 minutes  Vaughan Basta, MD

## 2018-08-21 NOTE — Progress Notes (Signed)
[facility] NAME: Dessa Ledee    MR#:  [id number]  DATE OF BIRTH:  [birth year]  SUBJECTIVE:  CHIEF COMPLAINT:   Chief Complaint  Patient presents with  . Respiratory Distress   Patient has history of low back to me because of lung cancer.  Last few weeks had been treated for pneumonia but continued to get worse and have some chest pain with hypoxia so brought to the emergency room.  Noted to have large bilateral pleural effusion. Patient feels slightly better today compared to yesterday.  REVIEW OF SYSTEMS:  CONSTITUTIONAL: No fever, have fatigue or weakness.  EYES: No blurred or double vision.  EARS, NOSE, AND THROAT: No tinnitus or ear pain.  RESPIRATORY: No cough, have shortness of breath, wheezing or hemoptysis.  CARDIOVASCULAR: No chest pain, orthopnea, edema.  GASTROINTESTINAL: No nausea, vomiting, diarrhea or abdominal pain.  GENITOURINARY: No dysuria, hematuria.  ENDOCRINE: No polyuria, nocturia,  HEMATOLOGY: No anemia, easy bruising or bleeding SKIN: No rash or lesion. MUSCULOSKELETAL: No joint pain or arthritis.   NEUROLOGIC: No tingling, numbness, weakness.  PSYCHIATRY: No anxiety or depression.   ROS  DRUG ALLERGIES:   Allergies  Allergen Reactions  . Codeine Nausea Only  . Darvon [Propoxyphene] Nausea Only  . Etodolac Nausea Only  . Lisinopril Cough  . Morphine And Related Nausea Only  . Nortriptyline Nausea Only  . Tramadol Nausea Only    VITALS:  Blood pressure (!) 141/86, pulse 92, temperature 97.7 F (36.5 C), temperature source Oral, resp. rate 20, height 5\' 3"  (1.6 m), weight 67.7 kg, SpO2 96 %.  PHYSICAL EXAMINATION:  GENERAL:  84 y.o.-year-old patient lying in the bed with no acute distress.  EYES: Pupils equal, round, reactive to light and accommodation. No scleral icterus. Extraocular muscles intact.  HEENT: Head atraumatic, normocephalic. Oropharynx and nasopharynx clear.  Hard of  hearing. NECK:  Supple, no jugular venous distention. No thyroid enlargement, no tenderness.  LUNGS: Decreased breath sounds bilaterally, no wheezing, rales,rhonchi or crepitation. No use of accessory muscles of respiration.  On supplemental oxygen. CARDIOVASCULAR: S1, S2 normal. No murmurs, rubs, or gallops.  ABDOMEN: Soft, nontender, nondistended. Bowel sounds present. No organomegaly or mass.  EXTREMITIES: No pedal edema, cyanosis, or clubbing.  NEUROLOGIC: Cranial nerves II through XII are intact. Muscle strength 3/5 in all extremities. Sensation intact. Gait not checked.  PSYCHIATRIC: The patient is alert and oriented x 2.  SKIN: No obvious rash, lesion, or ulcer.   Physical Exam LABORATORY PANEL:   CBC Recent Labs  Lab 08/21/18 0511  WBC 2.7*  HGB 11.8*  HCT 34.6*  PLT 207   ------------------------------------------------------------------------------------------------------------------  Chemistries  Recent Labs  Lab 08/20/18 1213 08/20/18 1548 08/21/18 0511  NA 133*  --  133*  K 4.4  --  4.5  CL 99  --  102  CO2 24  --  25  GLUCOSE 166*  --  168*  BUN 19  --  22  CREATININE 1.00  --  1.05*  CALCIUM 8.6*  --  8.3*  MG  --  2.1  --   AST 22  --   --   ALT 9  --   --   ALKPHOS 53  --   --   BILITOT 0.7  --   --    ------------------------------------------------------------------------------------------------------------------  Cardiac Enzymes Recent Labs  Lab 08/20/18 1213  TROPONINI 0.03*   ------------------------------------------------------------------------------------------------------------------  RADIOLOGY:  Ct Angio Chest Pe W And/or Wo  Contrast  Result Date: 08/20/2018 CLINICAL DATA:  Respiratory distress.  Low O2 saturation.  Wheezing. EXAM: CT ANGIOGRAPHY CHEST WITH CONTRAST TECHNIQUE: Multidetector CT imaging of the chest was performed using the standard protocol during bolus administration of intravenous contrast. Multiplanar CT image  reconstructions and MIPs were obtained to evaluate the vascular anatomy. CONTRAST:  59mL OMNIPAQUE IOHEXOL 350 MG/ML SOLN COMPARISON:  Chest x-ray dated 08/20/2018 and CT scan dated 02/16/2015 FINDINGS: Cardiovascular: There are no pulmonary emboli. Aortic atherosclerosis. Overall heart size is normal. RV LV ratio is normal. Mediastinum/Nodes: No enlarged mediastinal, hilar, or axillary lymph nodes. Thyroid gland, trachea, and esophagus demonstrate no significant findings. Lungs/Pleura: Large bilateral pleural effusions. Compressive atelectasis in both lower lobes. Some of the pleural fluid is loculated on the right. Emphysematous changes in the both lungs but primarily in the right upper lobe. There are 2 irregular small areas of abnormal soft tissue density in the right upper lobe adjacent to emphysematous blebs. These probably represent inflammatory disease but follow-up is recommended to exclude small mass lesions. Upper Abdomen: No acute abnormality. Extensive atherosclerosis. Dense calcification in the origins of both renal arteries. Musculoskeletal: Old mild compression fracture of T5. No acute bone abnormality. Review of the MIP images confirms the above findings. IMPRESSION: 1. Large bilateral pleural effusions with compressive atelectasis in the lower lobes. 2. Aortic Atherosclerosis (ICD10-I70.0) and Emphysema (ICD10-J43.9). 3. Two small areas of abnormal density in the right upper lobe, most likely inflammatory. I recommend follow-up chest x-ray in 2 months. 4. No pulmonary emboli. Electronically Signed   By: Lorriane Shire M.D.   On: 08/20/2018 13:57   Dg Chest Port 1 View  Result Date: 08/20/2018 CLINICAL DATA:  Respiratory distress EXAM: PORTABLE CHEST 1 VIEW COMPARISON:  Chest CT February 16, 2015; chest radiograph February 16, 2015 FINDINGS: There is a pleural effusion on the right, apparently partially loculated, with right base atelectasis. There is a minimal left pleural effusion. There is no frank  airspace consolidation. Heart is upper normal in size with pulmonary vascularity normal. There is aortic atherosclerosis. No adenopathy. Bones appear osteoporotic. No pneumothorax. No acute fracture demonstrable. IMPRESSION: Pleural effusion on the right, partially loculated, with minimal pleural effusion on the left. Atelectatic change right base. No consolidation. Stable cardiac silhouette. There is aortic atherosclerosis. Bones are osteoporotic. Aortic Atherosclerosis (ICD10-I70.0). Electronically Signed   By: Lowella Grip III M.D.   On: 08/20/2018 12:16    ASSESSMENT AND PLAN:   Active Problems:   COPD exacerbation (HCC)   Pressure injury of skin  * Acute respiratory failure with hypoxia due to Large bilateral pleural effusions and COPD exacerbation. Continue oxygen by nasal cannula, IV Solu-Medrol, albuterol every 6 hours, Robitussin as needed.  * Possible acute on chronic CHF.  Start CHF protocol, Lasix twice daily and echocardiograph.  Cardiology consult appreciated, EF 45%  * Bacteremia 1 blood cultures positive for staph species It could be contamination as patient clinically appears fine. But patient was treated with pneumonia and still have effusion, I would call ID consult to help determine further treatment.  * Hyponatremia.  Follow-up sodium level while on Lasix.  * Diabetes.  Start sliding scale.  * Hypertension.  Continue home hypertension medication.  Discussed with patient's daughter, she wants to meet with the palliative care team as she feels her mother is 84 year old and if this thing happens again she would not like to bring her back to the hospital but just to keep her comfortable.  All the records are reviewed  and case discussed with Care Management/Social Workerr. Management plans discussed with the patient, family and they are in agreement.  CODE STATUS: DNR  TOTAL TIME TAKING CARE OF THIS PATIENT: 45 minutes.    POSSIBLE D/C IN 1-2 DAYS,  DEPENDING ON CLINICAL CONDITION.   Vaughan Basta M.D on 08/21/2018   Between 7am to 6pm - Pager - [phone]  After 6pm go to [email or web address] - password [email or web address] [facility]  Office  [phone]  CC: Primary care physician; Marinda Elk, MD  Note: This dictation was prepared with Dragon dictation along with smaller phrase technology. Any transcriptional errors that result from this process are unintentional.

## 2018-08-22 ENCOUNTER — Inpatient Hospital Stay: Payer: Medicare Other

## 2018-08-22 DIAGNOSIS — I5031 Acute diastolic (congestive) heart failure: Secondary | ICD-10-CM

## 2018-08-22 DIAGNOSIS — R778 Other specified abnormalities of plasma proteins: Secondary | ICD-10-CM

## 2018-08-22 DIAGNOSIS — J90 Pleural effusion, not elsewhere classified: Secondary | ICD-10-CM

## 2018-08-22 DIAGNOSIS — R7989 Other specified abnormal findings of blood chemistry: Secondary | ICD-10-CM

## 2018-08-22 DIAGNOSIS — J441 Chronic obstructive pulmonary disease with (acute) exacerbation: Secondary | ICD-10-CM

## 2018-08-22 DIAGNOSIS — G894 Chronic pain syndrome: Secondary | ICD-10-CM

## 2018-08-22 DIAGNOSIS — R748 Abnormal levels of other serum enzymes: Secondary | ICD-10-CM

## 2018-08-22 DIAGNOSIS — Z515 Encounter for palliative care: Secondary | ICD-10-CM

## 2018-08-22 DIAGNOSIS — N179 Acute kidney failure, unspecified: Secondary | ICD-10-CM

## 2018-08-22 DIAGNOSIS — J9601 Acute respiratory failure with hypoxia: Secondary | ICD-10-CM

## 2018-08-22 DIAGNOSIS — Z66 Do not resuscitate: Secondary | ICD-10-CM

## 2018-08-22 DIAGNOSIS — Z7189 Other specified counseling: Secondary | ICD-10-CM

## 2018-08-22 DIAGNOSIS — J449 Chronic obstructive pulmonary disease, unspecified: Secondary | ICD-10-CM

## 2018-08-22 LAB — BASIC METABOLIC PANEL
Anion gap: 8 (ref 5–15)
BUN: 39 mg/dL — ABNORMAL HIGH (ref 8–23)
CO2: 25 mmol/L (ref 22–32)
CREATININE: 1.41 mg/dL — AB (ref 0.44–1.00)
Calcium: 8.3 mg/dL — ABNORMAL LOW (ref 8.9–10.3)
Chloride: 99 mmol/L (ref 98–111)
GFR, EST AFRICAN AMERICAN: 36 mL/min — AB (ref >60)
GFR, EST NON AFRICAN AMERICAN: 31 mL/min — AB (ref >60)
Glucose, Bld: 228 mg/dL — ABNORMAL HIGH (ref 70–99)
POTASSIUM: 4.4 mmol/L (ref 3.5–5.1)
SODIUM: 132 mmol/L — AB (ref 135–145)

## 2018-08-22 LAB — GLUCOSE, CAPILLARY
GLUCOSE-CAPILLARY: 187 mg/dL — AB (ref 70–99)
GLUCOSE-CAPILLARY: 190 mg/dL — AB (ref 70–99)
Glucose-Capillary: 231 mg/dL — ABNORMAL HIGH (ref 70–99)
Glucose-Capillary: 278 mg/dL — ABNORMAL HIGH (ref 70–99)
Glucose-Capillary: 207 mg/dL — ABNORMAL HIGH (ref 70–99)

## 2018-08-22 MED ORDER — METOPROLOL TARTRATE 25 MG PO TABS
37.5000 mg | ORAL_TABLET | Freq: Two times a day (BID) | ORAL | Status: DC
  Administered 2018-08-22 – 2018-08-24 (×4): 37.5 mg via ORAL
  Filled 2018-08-22 (×4): qty 2

## 2018-08-22 MED ORDER — ENOXAPARIN SODIUM 30 MG/0.3ML ~~LOC~~ SOLN
30.0000 mg | SUBCUTANEOUS | Status: DC
  Administered 2018-08-22 – 2018-08-23 (×2): 30 mg via SUBCUTANEOUS
  Filled 2018-08-22 (×2): qty 0.3

## 2018-08-22 MED ORDER — FUROSEMIDE 10 MG/ML IJ SOLN: 20.0000 mg | Freq: Every day | INTRAMUSCULAR | Status: DC

## 2018-08-22 MED ORDER — METHYLPREDNISOLONE SODIUM SUCC 40 MG IJ SOLR
40.0000 mg | Freq: Every day | INTRAMUSCULAR | Status: DC
  Administered 2018-08-23 – 2018-08-24 (×2): 40 mg via INTRAVENOUS
  Filled 2018-08-22 (×2): qty 1

## 2018-08-22 NOTE — Progress Notes (Signed)
   08/22/18 1505  Clinical Encounter Type  Visited With Patient  Visit Type Initial;Spiritual support  Referral From Nurse  Consult/Referral To Chaplain  Spiritual Encounters  Spiritual Needs Emotional   CH was requested to visit with Ms. Joann Wilkinson. 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 [name] is alert and doing a cross word puzzle. I will continue follow up with the patient.

## 2018-08-22 NOTE — Progress Notes (Signed)
[facility] [place] NAME: Joann Wilkinson    MR#:  [id number]  DATE OF BIRTH:  Apr 13, 1922  SUBJECTIVE:  CHIEF COMPLAINT:   Chief Complaint  Patient presents with  . Respiratory Distress   Patient has history of low back to me because of lung cancer.  Last few weeks had been treated for pneumonia but continued to get worse and have some chest pain with hypoxia so brought to the emergency room.  Noted to have large bilateral pleural effusion. Patient feels better today compared to yesterday. Still on oxygen.  REVIEW OF SYSTEMS:  CONSTITUTIONAL: No fever, have fatigue or weakness.  EYES: No blurred or double vision.  EARS, NOSE, AND THROAT: No tinnitus or ear pain.  RESPIRATORY: No cough, have shortness of breath, wheezing or hemoptysis.  CARDIOVASCULAR: No chest pain, orthopnea, edema.  GASTROINTESTINAL: No nausea, vomiting, diarrhea or abdominal pain.  GENITOURINARY: No dysuria, hematuria.  ENDOCRINE: No polyuria, nocturia,  HEMATOLOGY: No anemia, easy bruising or bleeding SKIN: No rash or lesion. MUSCULOSKELETAL: No joint pain or arthritis.   NEUROLOGIC: No tingling, numbness, weakness.  PSYCHIATRY: No anxiety or depression.   ROS  DRUG ALLERGIES:   Allergies  Allergen Reactions  . Codeine Nausea Only  . Darvon [Propoxyphene] Nausea Only  . Etodolac Nausea Only  . Lisinopril Cough  . Morphine And Related Nausea Only  . Nortriptyline Nausea Only  . Tramadol Nausea Only    VITALS:  Blood pressure 123/63, pulse 80, temperature 98.1 F (36.7 C), temperature source Oral, resp. rate 19, height 5\' 3"  (1.6 m), weight 65.5 kg, SpO2 98 %.  PHYSICAL EXAMINATION:  GENERAL:  84 y.o.-year-old patient lying in the bed with no acute distress.  EYES: Pupils equal, round, reactive to light and accommodation. No scleral icterus. Extraocular muscles intact.  HEENT: Head atraumatic, normocephalic. Oropharynx and nasopharynx clear.  Hard of  hearing. NECK:  Supple, no jugular venous distention. No thyroid enlargement, no tenderness.  LUNGS: Decreased breath sounds bilaterally, no wheezing, have crepitation. No use of accessory muscles of respiration.  On supplemental oxygen. CARDIOVASCULAR: S1, S2 normal. No murmurs, rubs, or gallops.  ABDOMEN: Soft, nontender, nondistended. Bowel sounds present. No organomegaly or mass.  EXTREMITIES: No pedal edema, cyanosis, or clubbing.  NEUROLOGIC: Cranial nerves II through XII are intact. Muscle strength 3/5 in all extremities. Sensation intact. Gait not checked.  PSYCHIATRIC: The patient is alert and oriented x 2.  SKIN: No obvious rash, lesion, or ulcer.   Physical Exam LABORATORY PANEL:   CBC Recent Labs  Lab 08/21/18 0511  WBC 2.7*  HGB 11.8*  HCT 34.6*  PLT 207   ------------------------------------------------------------------------------------------------------------------  Chemistries  Recent Labs  Lab 08/20/18 1213 08/20/18 1548  08/22/18 0412  NA 133*  --    < > 132*  K 4.4  --    < > 4.4  CL 99  --    < > 99  CO2 24  --    < > 25  GLUCOSE 166*  --    < > 228*  BUN 19  --    < > 39*  CREATININE 1.00  --    < > 1.41*  CALCIUM 8.6*  --    < > 8.3*  MG  --  2.1  --   --   AST 22  --   --   --   ALT 9  --   --   --   ALKPHOS 53  --   --   --  BILITOT 0.7  --   --   --    < > = values in this interval not displayed.   ------------------------------------------------------------------------------------------------------------------  Cardiac Enzymes Recent Labs  Lab 08/20/18 1213 08/21/18 1320  TROPONINI 0.03* 0.10*   ------------------------------------------------------------------------------------------------------------------  RADIOLOGY:  Dg Chest 2 View  Result Date: 08/22/2018 CLINICAL DATA:  Hypoxia EXAM: CHEST - 2 VIEW COMPARISON:  08/20/2018 FINDINGS: Cardiac shadow is stable. Bilateral pleural effusions are again noted right greater than  left. The overall appearance is stable. Some loculated fluid is noted within the fissure on the right also stable from the prior exam. No focal confluent infiltrate is seen. IMPRESSION: Bilateral pleural effusions stable from the previous exam. Electronically Signed   By: Inez Catalina M.D.   On: 08/22/2018 11:30    ASSESSMENT AND PLAN:   Active Problems:   COPD exacerbation (HCC)   Pressure injury of skin  * Acute respiratory failure with hypoxia due to Large bilateral pleural effusions and COPD exacerbation. Continue oxygen by nasal cannula, IV Solu-Medrol-decrease frequency now as patient is improving., albuterol every 6 hours, Robitussin as needed.  * Possible acute on chronic CHF.  Start CHF protocol, Lasix twice daily and echocardiograph.  Cardiology consult appreciated, EF 45% Decrease Lasix now as patient has slight worsening in the renal function.  * Bacteremia 1 blood cultures positive for staph species It could be contamination as patient clinically appears fine. Discussed with ID physician on the phone and reviewed the results as it looks like contamination, will not do ID consult and stop antibiotics.  * Hyponatremia.  Follow-up sodium level while on Lasix.  * Diabetes.  Start sliding scale.  * Hypertension.  Continue home hypertension medication.  Discussed with patient's daughter, she wants to meet with the palliative care team as she feels her mother is 35 year old and if this thing happens again she would not like to bring her back to the hospital but just to keep her comfortable.  She have 24 hour care taker at home. May need oxygen arrangement at discharge, will check for that.  All the records are reviewed and case discussed with Care Management/Social Workerr. Management plans discussed with the patient, family and they are in agreement.  CODE STATUS: DNR  TOTAL TIME TAKING CARE OF THIS PATIENT: 35 minutes.    POSSIBLE D/C IN 1-2 DAYS, DEPENDING ON  CLINICAL CONDITION.   Vaughan Basta M.D on 08/22/2018   Between 7am to 6pm - Pager - [phone]  After 6pm go to [email or web address] - password [email or web address] [facility]  Office  [phone]  CC: Primary care physician; Marinda Elk, MD  Note: This dictation was prepared with Dragon dictation along with smaller phrase technology. Any transcriptional errors that result from this process are unintentional.

## 2018-08-22 NOTE — Progress Notes (Signed)
Progress Note  Patient Name: Joann Wilkinson Date of Encounter: 08/22/2018  Primary Cardiologist: Nelva Bush, MD   Subjective   Breathing improved, though patient has remained in bed.  No chest pain.  Inpatient Medications    Scheduled Meds: . albuterol  2.5 mg Nebulization Q6H  . amLODipine  5 mg Oral Daily  . aspirin EC  81 mg Oral Daily  . enoxaparin (LOVENOX) injection  30 mg Subcutaneous Q24H  . fluticasone  1 spray Each Nare QHS  . [START ON 08/23/2018] furosemide  20 mg Intravenous Daily  . insulin aspart  0-5 Units Subcutaneous QHS  . insulin aspart  0-9 Units Subcutaneous TID WC  . [START ON 08/23/2018] methylPREDNISolone (SOLU-MEDROL) injection  40 mg Intravenous Daily  . metoprolol tartrate  25 mg Oral BID  . mometasone-formoterol  2 puff Inhalation BID  . sodium chloride flush  3 mL Intravenous Q12H   Continuous Infusions: . sodium chloride     PRN Meds: sodium chloride, acetaminophen **OR** acetaminophen, albuterol, bisacodyl, guaiFENesin-dextromethorphan, Melatonin, metoprolol tartrate, ondansetron **OR** ondansetron (ZOFRAN) IV, pneumococcal 23 valent vaccine, senna-docusate, sodium chloride flush, traZODone   Vital Signs    Vitals:   08/22/18 0637 08/22/18 0738 08/22/18 0957 08/22/18 1555  BP:   123/63 (!) 142/58  Pulse:   80 87  Resp:    (!) 24  Temp:    98 F (36.7 C)  TempSrc:    Oral  SpO2:  98%  94%  Weight: 65.5 kg     Height:        Intake/Output Summary (Last 24 hours) at 08/22/2018 1754 Last data filed at 08/22/2018 1522 Gross per 24 hour  Intake 240 ml  Output 1450 ml  Net -1210 ml   Filed Weights   08/20/18 1556 08/21/18 0500 08/22/18 0637  Weight: 68.4 kg 67.7 kg 65.5 kg    Telemetry    NSR with paroxysmal atrial tachycardia - Personally Reviewed  ECG    No new tracing  Physical Exam   GEN: No acute distress.   Neck: No JVD Cardiac: RRR, no murmurs, rubs, or gallops.  Respiratory: Wheezes at lung apices.  Breath  sounds very diminished in lower 2/3 of both lung fields. GI: Soft, nontender, non-distended  MS: 1+ pretibial edema bilaterally; No deformity. Neuro:  Nonfocal  Psych: Normal affect   Labs    Chemistry Recent Labs  Lab 08/20/18 1213 08/21/18 0511 08/22/18 0412  NA 133* 133* 132*  K 4.4 4.5 4.4  CL 99 102 99  CO2 24 25 25   GLUCOSE 166* 168* 228*  BUN 19 22 39*  CREATININE 1.00 1.05* 1.41*  CALCIUM 8.6* 8.3* 8.3*  PROT 6.9  --   --   ALBUMIN 3.3*  --   --   AST 22  --   --   ALT 9  --   --   ALKPHOS 53  --   --   BILITOT 0.7  --   --   GFRNONAA 47* 44* 31*  GFRAA 54* 51* 36*  ANIONGAP 10 6 8      Hematology Recent Labs  Lab 08/20/18 1213 08/21/18 0511  WBC 5.9 2.7*  RBC 3.51* 3.31*  HGB 12.6 11.8*  HCT 36.8 34.6*  MCV 105.0* 104.6*  MCH 35.9* 35.8*  MCHC 34.2 34.2  RDW 14.4 14.3  PLT 222 207    Cardiac Enzymes Recent Labs  Lab 08/20/18 1213 08/21/18 1320  TROPONINI 0.03* 0.10*   No results for input(s):  TROPIPOC in the last 168 hours.   BNPNo results for input(s): BNP, PROBNP in the last 168 hours.   DDimer No results for input(s): DDIMER in the last 168 hours.   Radiology    Dg Chest 2 View  Result Date: 08/22/2018 CLINICAL DATA:  Hypoxia EXAM: CHEST - 2 VIEW COMPARISON:  08/20/2018 FINDINGS: Cardiac shadow is stable. Bilateral pleural effusions are again noted right greater than left. The overall appearance is stable. Some loculated fluid is noted within the fissure on the right also stable from the prior exam. No focal confluent infiltrate is seen. IMPRESSION: Bilateral pleural effusions stable from the previous exam. Electronically Signed   By: Inez Catalina M.D.   On: 08/22/2018 11:30    Cardiac Studies   Echocardiogram (08/21/18): - Procedure narrative: Transthoracic echocardiography. The study   was technically difficult, as a result of poor acoustic windows.   Apical images could not be obtained. - Left ventricle: The cavity size was  normal. Wall thickness was   increased in a pattern of mild LVH. Systolic function was mildly   reduced. The estimated ejection fraction was in the range of 45%   to 50%. The study is not technically sufficient to allow   evaluation of LV diastolic function. - Mitral valve: Calcified annulus. Thickening and calcification of   the anterior leaflet. Leaflet separation was reduced. Mitral   stenosis cannot be excluded. Consider transesophageal   echocardiogram if there is clinical concern for mitral stenosis. - Pericardium, extracardiac: A trivial pericardial effusion was   identified. There was a left pleural effusion.  Patient Profile     84 y.o. female COPD/asthma, anemia, HTN, DM, lung CA, and oral CA, admitted with progressive shortness of breath due to acute heart failure +/- COPD complicated by large bilateral pleural effusions.  Assessment & Plan    Acute HFpEF LVEF appears to be mildly reduced on echo, though study is quite limited.  Patient is net negative 1.3 L since admission (though incompletely recorded intake).  Bump in creatinine noted.  Possible mitral stenosis may be contributing.  Hold IV furosemide, given acute kidney injury.  Consider therapeutic thoracentesis, as this will most likely offer the greatest symptomatic improvement.  Increase metoprolol tartrate to 37.5 mg BID.  Elevated troponin Mild, most likely due to supply-demand mismatch.  No chest pain.  Defer ischemia evaluation at this time, given continued poor air movement, acute kidney injury, and age/frailty.  Paroxysmal atrial tachycardia Asymptomatic.  Increase metoprolol tartrate to 37.5 mg BID.  Maintain K and Mg > 4.0 and 2.0, respectively.  Acute kidney injury May be due to diuresis and/or CIN from recent CTA chest.  Hold diuresis for now; repeat BMP tomorrow.  Avoid nephrotoxic drugs, including IV contrast.  Asthma/COPD May be exacerbated by HFpEF.  Wean steroids, as  tolerated.  Continue bronchodilators, per IM.  For questions or updates, please contact [place] Please consult [email or web address] for contact info under [facility].     Signed, Nelva Bush, MD  08/22/2018, 5:54 PM

## 2018-08-22 NOTE — Progress Notes (Signed)
Anticoagulation monitoring(Lovenox):  84 yo  female ordered Lovenox 40 mg Q24h  Filed Weights   08/20/18 1556 08/21/18 0500 08/22/18 0637  Weight: 150 lb 12.7 oz (68.4 kg) 149 lb 3.2 oz (67.7 kg) 144 lb 8 oz (65.5 kg)   Body mass index is 25.6 kg/m.    Lab Results  Component Value Date   CREATININE 1.41 (H) 08/22/2018   CREATININE 1.05 (H) 08/21/2018   CREATININE 1.00 08/20/2018   Estimated Creatinine Clearance: 22.2 mL/min (A) (by C-G formula based on SCr of 1.41 mg/dL (H)). Hemoglobin & Hematocrit     Component Value Date/Time   HGB 11.8 (L) 08/21/2018 0511   HGB 9.2 (L) 03/06/2015 0601   HCT 34.6 (L) 08/21/2018 0511   HCT 34.3 (L) 03/04/2015 0411     Per Protocol for Patient with estCrcl < 30 ml/min and BMI < 40, will transition to Lovenox 30 mg Q24h.

## 2018-08-22 NOTE — Consult Note (Signed)
Consultation Note Date: 08/22/2018   Patient Name: Joann Wilkinson  DOB: 01-20-1922  MRN: [id number]  Age / Sex: 84 y.o., female  PCP: Marinda Elk, MD Referring Physician: Vaughan Basta, *  Reason for Consultation: Establishing goals of care  HPI/Patient Profile: 84 y.o. female admitted on 08/20/2018 from home with complaints of worsening shortness of breath.  She has a past medical history significant for osteoarthritis, oral cancer, lung cancer, shingles, hypertension, diabetes, COPD, CHF, asthma, anemia, TIAs, and retinal artery occlusion.  Patient presented to the emergency department after a 2-week long course of antibiotic treatments for pneumonia.  Patient and family reported that she had finished antibiotics however her shortness of breath seem to have worsened over several days prior to admission.  Patient also endorses bilateral leg swelling for the past 2 weeks.  Her ED course chest x-ray showed pleural effusions.  CT angios of the chest was negative for PE but did show bilateral large pleural effusions.  Sodium 133.  WBC 5.9.  Troponin 0.03.  His admission patient continues on IV Solu-Medrol, CHF protocol, and receiving electrolyte replacement.  She is being followed by cardiology.  Palliative medicine team consulted for goals of care discussion.  Clinical Assessment and Goals of Care: I have reviewed medical records including lab results, imaging, Epic notes, and MAR, received report from the bedside RN, and assessed the patient. I then met at the bedside with patient, and her daughters Joann Wilkinson and Joann Wilkinson to discuss diagnosis prognosis, GOC, EOL wishes, disposition and options.  Patient is alert and oriented to self, family, and location.  She does have intermittent confusion and forgetfulness.  During introduction patient repeatedly introduce her daughters and stated they were coming to the  hospital to make sure she was okay.  Patient able to somewhat engage in goals of care discussion however daughters are documented HCPOA's and I will rely on their decisions for planning.   I introduced Palliative Medicine as specialized medical care for people living with serious illness. It focuses on providing relief from the symptoms and stress of a serious illness. The goal is to improve quality of life for both the patient and the family.  We discussed a brief life review of the patient.  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  Daughter is now continue to manage and run the locally owns DMV facility.  Patient is of Christian faith.  Patient has 2 daughters Joann Wilkinson and Joann Wilkinson who are both her documented healthcare power of attorney's.  She also has 1 son who lives out of state.  As far as functional and nutritional status.  According to daughters patient was living at home with 24/7 caregivers prior to admission.  Patient has not lived on her own and been independent for approximately 2 years according to daughters.  Reports over the past several months they have noticed an increase in her confusion and forgetfulness.  Daughter states  patient had began to eat meals in bed and had noticed that she began to sleep more often.  Patient does require assistance with her ADLs.  Daughter states that they had several medical equipment pieces from caring for their father and patient currently has a lift chair, shower chair, or walker, and air mattress in the home which is used to provide care.  We discussed her current illness and what it means in the larger context of her on-going co-morbidities.  Natural disease trajectory and expectations at EOL were discussed.  Daughters verbalized their understanding of their mother's current illness and ongoing comorbidities.  They state that their mother has lived a great  life and unfortunately they remain hopeful for the best but are also prepared for the worst.  Family states that her quality of life is what matters to them the most.  She does not like coming back and forth to doctor's or hospitals.  She is always informed them that she would want to be at home for end-of-life care if possible.  I attempted to elicit values and goals of care important to the patient.    The difference between aggressive medical intervention and comfort care was considered in light of the patient's goals of care.  At this time daughters have verbalized their goals are to continue to treat the treatable while hospitalized goal of returning home but more of a comfort care approach.  They are not interested in returning back to the hospital if she can be kept comfortable in her home.  We discussed what comfort care includes, such as keeping patient comfortable in treating any symptoms they may cause discomfort such as shortness of breath, pain, anxiety, agitation, nausea etc.  Family is aware that aggressive medical interventions such as radiology testing, lab work, IV fluids, and some medications which were not focused on her comfort would not be a part of her care.  Advanced directives, concepts specific to code status, artifical feeding and hydration, and rehospitalization were considered and discussed.  Patient has a documented living will and healthcare power of attorney.  Both of her daughters are listed as her  HCPOA's and patient also has documented her wishes to be a DNR, no artificial feedings or hydration, or dialysis.  I have reviewed these documents and copies were placed in patient's chart.  Patient also verbalized and confirm that she would not want to receive any heroic or life-sustaining measures.  Both patient and her family has confirmed she is to remain a DNR/DNI.  Daughters also verbalized that they have a out of facility form which is posted on the refrigerator in her home.   Given daughter's wishes for patient to return home with comfort care and their goal for her not to continue to have re-hospitalizations a M0ST form was completed after detailed education with the family.  Family was given a copy of the form and original has been placed in patient's chart.  Patient and family wishes were identified on the MOST form which indicated patient is a DNR, comfort measures to be given in the home with no transport to hospital, no IV fluids, no PEG tubes, and they did choose to do antibiotics if indicated.  Hospice and Palliative Care services outpatient were explained and offered.  Given patient and family's goals of care they were educated on outpatient hospice services in the home.  Discussed the goals and philosophy of hospice services.  Family verbalizes their understanding and their request for patient to receive  outpatient hospice services at discharge.  Questions and concerns were addressed.  Hard Choices booklet left for review. The family was encouraged to call with questions or concerns.  PMT will continue to support holistically.  Primary decision maker: HCPOA-daughters: Bevely Palmer and Marva Panda.    SUMMARY OF RECOMMENDATIONS    DNR/DNI-as confirmed by patient and family.  A copy of her advanced directives have been placed in the chart.  MOST form completed with family and copy also placed in chart.  Continue to treat the treatable while hospitalized without escalation of care.  Family has requested for patient to have outpatient hospice services once discharge.  The goal is for patient to return home with her current 24/7 home care and hospice.  Case management referral for outpatient hospice in the home at discharge.  Family states patient has chronic pain and they are concerned that due to her high sensitivity to pain medications, that she would not be appropriately managed.  We discussed in details that patient can be given pain medication given her  intolerance is nausea.  Informed patient that Roxanol would be ideal to assist with pain as well as shortness of breath as needed and we could include that patient was to receive an antiemetic such as Zofran sublingual prior to administration to assist with preventing nausea.  Palliative medicine team will continue to support patient, patient's family, and medical team during hospitalization.  Code Status/Advance Care Planning:  DNR/DNI   Symptom Management:   If patient begins to complain of pain not controlled by Tylenol or experiences shortness of breath will consider ordering Roxanol 2.5/5 mg as needed with Zofran SL given prior to administration as a premedication for nausea.  Palliative Prophylaxis:   Aspiration, Bowel Regimen, Delirium Protocol, Frequent Pain Assessment, Oral Care, Palliative Wound Care and Turn Reposition  Additional Recommendations (Limitations, Scope, Preferences):  Full Scope Treatment, No Artificial Feeding, No Diagnostics, No Hemodialysis, No Surgical Procedures and No Tracheostomy  Psycho-social/Spiritual:   Desire for further Chaplaincy support:no  Prognosis:   Unable to determine-poor in the setting of osteoarthritis, lung cancer, diabetes, COPD, CHF, pneumonia, bilateral pleural effusions, coronary artery disease, asthma, TIAs, decreased mobility, decreased p.o. intake.  Discharge Planning: Home with Hospice      Primary Diagnoses: Present on Admission: . COPD exacerbation ([place])   I have reviewed the medical record, interviewed the patient and family, and examined the patient. The following aspects are pertinent.  Past Medical History:  Diagnosis Date  . Anemia   . Asthma   . CAD (coronary artery disease)   . Carpal tunnel syndrome   . CHF (congestive heart failure) ([place])   . COPD (chronic obstructive pulmonary disease) ([place])   . Diabetes mellitus without complication ([place])   . Essential hypertension   . History of shingles   . Lung  cancer ([place])   . Oral cancer ([place])   . Osteoarthritis   . Retinal artery occlusion   . Stroke ([place])   . TIA (transient ischemic attack)    Social History   Socioeconomic History  . Marital status: Widowed    Spouse name: Not on file  . Number of children: Not on file  . Years of education: Not on file  . Highest education level: Not on file  Occupational History  . Not on file  Social Needs  . Financial resource strain: Not on file  . Food insecurity:    Worry: Not on file    Inability: Not on file  .  Transportation needs:    Medical: Not on file    Non-medical: Not on file  Tobacco Use  . Smoking status: Former Smoker    Years: 34.00    Types: Cigarettes  Substance and Sexual Activity  . Alcohol use: No    Alcohol/week: 0.0 standard drinks  . Drug use: No  . Sexual activity: Not on file  Lifestyle  . Physical activity:    Days per week: Not on file    Minutes per session: Not on file  . Stress: Not on file  Relationships  . Social connections:    Talks on phone: Not on file    Gets together: Not on file    Attends religious service: Not on file    Active member of club or organization: Not on file    Attends meetings of clubs or organizations: Not on file    Relationship status: Not on file  Other Topics Concern  . Not on file  Social History Narrative   Lives locally in her own home.  Has around the clock assistants that stay with her.  Family nearby.  Does not routinely exercise.  Ambulates w/ walker.   Family History  Problem Relation Age of Onset  . CAD Mother   . CAD Father    Scheduled Meds: . albuterol  2.5 mg Nebulization Q6H  . amLODipine  5 mg Oral Daily  . aspirin EC  81 mg Oral Daily  . enoxaparin (LOVENOX) injection  30 mg Subcutaneous Q24H  . fluticasone  1 spray Each Nare QHS  . [START ON 08/23/2018] furosemide  20 mg Intravenous Daily  . insulin aspart  0-5 Units Subcutaneous QHS  . insulin aspart  0-9 Units Subcutaneous TID WC  .  methylPREDNISolone (SOLU-MEDROL) injection  125 mg Intravenous Once  . methylPREDNISolone (SOLU-MEDROL) injection  40 mg Intravenous Q6H  . metoprolol tartrate  25 mg Oral BID  . mometasone-formoterol  2 puff Inhalation BID  . sodium chloride flush  3 mL Intravenous Q12H   Continuous Infusions: . sodium chloride     PRN Meds:.sodium chloride, acetaminophen **OR** acetaminophen, albuterol, bisacodyl, guaiFENesin-dextromethorphan, Melatonin, metoprolol tartrate, ondansetron **OR** ondansetron (ZOFRAN) IV, pneumococcal 23 valent vaccine, senna-docusate, sodium chloride flush, traZODone Medications Prior to Admission:  Prior to Admission medications   Medication Sig Start Date End Date Taking? Authorizing Provider  amLODipine (NORVASC) 5 MG tablet Take 1 tablet by mouth daily. 07/22/18  Yes [provider]  aspirin EC 81 MG tablet Take 81 mg by mouth daily.   Yes [provider]  CALCIUM-MAGNESIUM-VITAMIN D PO Take 2 tablets by mouth at bedtime.   Yes [provider]  Cranberry ([facility] CRANBERRY) 500 MG CAPS Take 1 capsule by mouth daily.    Yes [provider]  fluticasone (FLONASE) 50 MCG/ACT nasal spray Place 1 spray into both nostrils at bedtime.   Yes [provider]  Fluticasone-Salmeterol (ADVAIR) 250-50 MCG/DOSE AEPB Inhale 1 puff into the lungs 2 (two) times daily.   Yes [provider]  albuterol (PROVENTIL HFA;VENTOLIN HFA) 108 (90 BASE) MCG/ACT inhaler Inhale 2 puffs into the lungs every 6 (six) hours as needed for wheezing or shortness of breath.    [provider]  conjugated estrogens (PREMARIN) vaginal cream Apply a blueberry sized amount of cream to finger tip and massage into tissue at vaginal opening Mon-Wed-Fri at bedtime Patient not taking: Reported on 08/20/2018 10/16/15   Roda Shutters, FNP   Allergies  Allergen Reactions  .  Codeine Nausea Only  . Darvon [Propoxyphene] Nausea Only  . Etodolac Nausea Only  .  Lisinopril Cough  . Morphine And Related Nausea Only  . Nortriptyline Nausea Only  . Tramadol Nausea Only   Review of Systems  Constitutional: Positive for activity change and fatigue.  Respiratory: Positive for cough and shortness of breath.   Cardiovascular: Positive for chest pain.  Musculoskeletal: Positive for back pain.  Neurological: Positive for weakness.  All other systems reviewed and are negative.   Physical Exam  Constitutional: Vital Wilkinson are normal. She is cooperative.  Cardiovascular: Normal rate, regular rhythm, normal heart sounds and normal pulses.  Lower extremity edema   Pulmonary/Chest: Effort normal. She has decreased breath sounds.  Neurological: She is alert.  Alert to family, self, and location with intermittent confusion/forgettfulness   Skin: Skin is warm and dry.  Small reddened area to sacrum, heel  Psychiatric: She has a normal mood and affect. Her speech is normal and behavior is normal. Thought content normal. Cognition and memory are impaired. She expresses inappropriate judgment.  Nursing note and vitals reviewed.   Vital Wilkinson: BP (!) 144/73 (BP Location: Left Arm)   Pulse 65   Temp 98.1 F (36.7 C) (Oral)   Resp 19   Ht 5' 3"  (1.6 m)   Wt 65.5 kg   SpO2 98%   BMI 25.60 kg/m  Pain Scale: 0-10   Pain Score: 0-No pain   SpO2: SpO2: 98 % O2 Device:SpO2: 98 % O2 Flow Rate: .O2 Flow Rate (L/min): 2 L/min  IO: Intake/output summary:   Intake/Output Summary (Last 24 hours) at 08/22/2018 0927 Last data filed at 08/22/2018 0600 Gross per 24 hour  Intake 200 ml  Output 1250 ml  Net -1050 ml    LBM: Last BM Date: (unsure) Baseline Weight: Weight: 70.1 kg Most recent weight: Weight: 65.5 kg     Palliative Assessment/Data:PPS 30%   Time In: 0930 Time Out: 1045 Time Total: 75 min.   Greater than 50%  of this time was spent counseling and coordinating care related to the above assessment and plan.  Signed by:  Alda Lea, AGPCNP-BC Palliative Medicine Team  Phone: [phone] Fax: [phone] Pager: [phone] Amion: Bjorn Pippin    Please contact Palliative Medicine Team phone at [phone] for questions and concerns.  For individual provider: See Shea Evans

## 2018-08-23 LAB — CBC
HCT: 31.4 % — ABNORMAL LOW (ref 35.0–47.0)
Hemoglobin: 10.8 g/dL — ABNORMAL LOW (ref 12.0–16.0)
MCH: 35.5 pg — ABNORMAL HIGH (ref 26.0–34.0)
MCHC: 34.3 g/dL (ref 32.0–36.0)
MCV: 103.6 fL — ABNORMAL HIGH (ref 80.0–100.0)
PLATELETS: 214 10*3/uL (ref 150–440)
RBC: 3.03 MIL/uL — AB (ref 3.80–5.20)
RDW: 14.1 % (ref 11.5–14.5)
WBC: 7.6 10*3/uL (ref 3.6–11.0)

## 2018-08-23 LAB — BASIC METABOLIC PANEL
Anion gap: 8 (ref 5–15)
BUN: 50 mg/dL — AB (ref 8–23)
CALCIUM: 7.8 mg/dL — AB (ref 8.9–10.3)
CHLORIDE: 98 mmol/L (ref 98–111)
CO2: 26 mmol/L (ref 22–32)
CREATININE: 1.5 mg/dL — AB (ref 0.44–1.00)
GFR calc non Af Amer: 29 mL/min — ABNORMAL LOW (ref >60)
GFR, EST AFRICAN AMERICAN: 33 mL/min — AB (ref >60)
Glucose, Bld: 212 mg/dL — ABNORMAL HIGH (ref 70–99)
Potassium: 4.4 mmol/L (ref 3.5–5.1)
SODIUM: 132 mmol/L — AB (ref 135–145)

## 2018-08-23 LAB — GLUCOSE, CAPILLARY
GLUCOSE-CAPILLARY: 207 mg/dL — AB (ref 70–99)
GLUCOSE-CAPILLARY: 210 mg/dL — AB (ref 70–99)
GLUCOSE-CAPILLARY: 247 mg/dL — AB (ref 70–99)
Glucose-Capillary: 262 mg/dL — ABNORMAL HIGH (ref 70–99)

## 2018-08-23 LAB — TSH: TSH: 1.482 u[IU]/mL (ref 0.350–4.500)

## 2018-08-23 LAB — MAGNESIUM: MAGNESIUM: 2.1 mg/dL (ref 1.7–2.4)

## 2018-08-23 MED ORDER — IPRATROPIUM-ALBUTEROL 0.5-2.5 (3) MG/3ML IN SOLN
3.0000 mL | Freq: Two times a day (BID) | RESPIRATORY_TRACT | Status: DC
  Administered 2018-08-23 – 2018-08-24 (×2): 3 mL via RESPIRATORY_TRACT
  Filled 2018-08-23 (×2): qty 3

## 2018-08-23 MED ORDER — IPRATROPIUM-ALBUTEROL 0.5-2.5 (3) MG/3ML IN SOLN
3.0000 mL | Freq: Four times a day (QID) | RESPIRATORY_TRACT | Status: DC
  Administered 2018-08-23: 08:00:00 3 mL via RESPIRATORY_TRACT
  Filled 2018-08-23: qty 3

## 2018-08-23 NOTE — Care Management Note (Addendum)
Case Management Note  Patient Details  Name: Joann Wilkinson MRN: [id number] Date of Birth: July 13, 1922  Subjective/Objective:    Admitted to [facility] with the diagnosis of chronic obstructive pulmonary disease. Lives at home with caregivers present. Daughter is Bevely Palmer [phone]). Dr. Keane Scrape is listed as primary care physician. [facility] [place].  [facility] 09/2015. Nebulizer in the home. Acute oxygen. Hospital bed, shower chair, rolling walker, bedside commode that is too small. And overhead tray table, Palliative Care consult for goals of care.                Action/Plan: Palliative representative met with family members yesterday. The goal is to go home with Hospice Services in place. Telephone call to Ms. Miller. Willing be coming to hospital this morning.  Discussed Hospice Agencies with daughter, Gregary Signs. [place]. Will update Flo Shanks RN representative.   Expected Discharge Date:                  Expected Discharge Plan:     In-House Referral:   yes  Discharge planning Services   yes  Post Acute Care Choice:   yes Choice offered to:   Daughter  DME Arranged:    DME Agency:     HH Arranged:   yes HH Agency:   [facility]  Status of Service:     If discussed at [place] of Stay Meetings, dates discussed:    Additional Comments:  Shelbie Ammons, RN  MSN CCM Care Management [phone] 08/23/2018, 8:55 AM

## 2018-08-23 NOTE — Progress Notes (Signed)
Progress Note  Patient Name: Joann Wilkinson Date of Encounter: 08/23/2018  Primary Cardiologist: End  Subjective   Dyspnea improved per daughter. No chest pain. Renal function continues to increase 1.05-->1.41-->1.5. HGB 12.6-->11.8-->10.8. CXR 08/22/18 showed stable bilateral pleural effusions.  Inpatient Medications    Scheduled Meds: . amLODipine  5 mg Oral Daily  . aspirin EC  81 mg Oral Daily  . enoxaparin (LOVENOX) injection  30 mg Subcutaneous Q24H  . fluticasone  1 spray Each Nare QHS  . insulin aspart  0-5 Units Subcutaneous QHS  . insulin aspart  0-9 Units Subcutaneous TID WC  . ipratropium-albuterol  3 mL Nebulization Q6H  . methylPREDNISolone (SOLU-MEDROL) injection  40 mg Intravenous Daily  . metoprolol tartrate  37.5 mg Oral BID  . mometasone-formoterol  2 puff Inhalation BID  . sodium chloride flush  3 mL Intravenous Q12H   Continuous Infusions: . sodium chloride     PRN Meds: sodium chloride, acetaminophen **OR** acetaminophen, albuterol, bisacodyl, guaiFENesin-dextromethorphan, Melatonin, metoprolol tartrate, ondansetron **OR** ondansetron (ZOFRAN) IV, pneumococcal 23 valent vaccine, senna-docusate, sodium chloride flush, traZODone   Vital Signs    Vitals:   08/22/18 2018 08/22/18 2019 08/23/18 0512 08/23/18 0739  BP:   (!) 145/65   Pulse:  78 75 64  Resp:   16 18  Temp:   (!) 97.5 F (36.4 C)   TempSrc:   Oral   SpO2: 95% 100% 99% 97%  Weight:   66.9 kg   Height:        Intake/Output Summary (Last 24 hours) at 08/23/2018 0853 Last data filed at 08/23/2018 5732 Gross per 24 hour  Intake 240 ml  Output 1000 ml  Net -760 ml   Filed Weights   08/21/18 0500 08/22/18 0637 08/23/18 0512  Weight: 67.7 kg 65.5 kg 66.9 kg    Telemetry    Not on telemetry - Personally Reviewed  ECG    n/a - Personally Reviewed  Physical Exam   GEN: Elderly and frail appearing; No acute distress. HOH. Neck: No JVD. Cardiac: RRR, no murmurs, rubs, or  gallops.  Respiratory: Diminished breath sounds bilaterally.  GI: Soft, nontender, non-distended.   MS: Trace bilateral pre-tibial edema; No deformity. Neuro:  Alert and oriented x 3; Nonfocal.  Psych: Normal affect.  Labs    Chemistry Recent Labs  Lab 08/20/18 1213 08/21/18 0511 08/22/18 0412 08/23/18 0419  NA 133* 133* 132* 132*  K 4.4 4.5 4.4 4.4  CL 99 102 99 98  CO2 24 25 25 26   GLUCOSE 166* 168* 228* 212*  BUN 19 22 39* 50*  CREATININE 1.00 1.05* 1.41* 1.50*  CALCIUM 8.6* 8.3* 8.3* 7.8*  PROT 6.9  --   --   --   ALBUMIN 3.3*  --   --   --   AST 22  --   --   --   ALT 9  --   --   --   ALKPHOS 53  --   --   --   BILITOT 0.7  --   --   --   GFRNONAA 47* 44* 31* 29*  GFRAA 54* 51* 36* 33*  ANIONGAP 10 6 8 8      Hematology Recent Labs  Lab 08/20/18 1213 08/21/18 0511 08/23/18 0419  WBC 5.9 2.7* 7.6  RBC 3.51* 3.31* 3.03*  HGB 12.6 11.8* 10.8*  HCT 36.8 34.6* 31.4*  MCV 105.0* 104.6* 103.6*  MCH 35.9* 35.8* 35.5*  MCHC 34.2 34.2 34.3  RDW 14.4 14.3 14.1  PLT 222 207 214    Cardiac Enzymes Recent Labs  Lab 08/20/18 1213 08/21/18 1320  TROPONINI 0.03* 0.10*   No results for input(s): TROPIPOC in the last 168 hours.   BNPNo results for input(s): BNP, PROBNP in the last 168 hours.   DDimer No results for input(s): DDIMER in the last 168 hours.   Radiology    Dg Chest 2 View  Result Date: 08/22/2018 IMPRESSION: Bilateral pleural effusions stable from the previous exam. Electronically Signed   By: Inez Catalina M.D.   On: 08/22/2018 11:30    Cardiac Studies   2-D Echo 08/21/2018: Study Conclusions  - Procedure narrative: Transthoracic echocardiography. The study   was technically difficult, as a result of poor acoustic windows.   Apical images could not be obtained. - Left ventricle: The cavity size was normal. Wall thickness was   increased in a pattern of mild LVH. Systolic function was mildly   reduced. The estimated ejection fraction was  in the range of 45%   to 50%. The study is not technically sufficient to allow   evaluation of LV diastolic function. - Mitral valve: Calcified annulus. Thickening and calcification of   the anterior leaflet. Leaflet separation was reduced. Mitral   stenosis cannot be excluded. Consider transesophageal   echocardiogram if there is clinical concern for mitral stenosis. - Pericardium, extracardiac: A trivial pericardial effusion was   identified. There was a left pleural effusion. __________  Patient Profile     84 y.o. female with history of COPD/asthma, anemia, HTN, DM, lung CA, and oral CA, admitted with progressive shortness of breath due to acute heart failure +/- COPD complicated by large bilateral pleural effusions.  Assessment & Plan    1. Acute HFpEF with systolic dysfunction: -Dyspnea improving -Recommend getting patient out of bed and into recliner to assess her symptoms. Suspect her dyspnea may worsen with exertion/movement making therapeutic thoracentesis possibly needed -Possibly exacerbated by mitral stenosis -Diuresis on hold given AKI -Consider therapeutic thoracentesis, this may offer most symptomatic relief of her dyspnea    2. Elevated troponin: -Mildly elevated at 0.1, most likely due to supply-demand mismatch -No chest pain -Echo as above -Given her acute respiratory status, AKI, age, and frail state ischemic evaluation as been deferred  3. PAT: -Continue Lopressor 37.5 mg bid -Maintain potassium > 4.0 and magnesium > 2.0 -Check magnesium and TSH  4. AKI: -Renal function continues to worsen -Possibly secondary to diuresis vs contrast-induced nephropathy -Continue to hold diuretics -Avoid nephrotoxic agents, including contrast -Trend BMET -Consider nephrology consult  For questions or updates, please contact [place] Please consult [email or web address] for contact info under Cardiology/STEMI.    Signed, Christell Faith, PA-C [place] Pager: [phone] 08/23/2018, 8:53 AM

## 2018-08-23 NOTE — Progress Notes (Signed)
New referral for [facility] services at home received from [facility]. Patient is a 84 year old woman admitted to [facility]  From home on  9/16 with complaints of increasing shortness of breath.  She has a past medical history of osteoarthritis, oral cancer, lung cancer, shingles, hypertension, diabetes, COPD, CHF, asthma, anemia, TIAs, and retinal artery occlusion.  She came to the emergency department after completing  a 2-Pweek long course of antibiotics for pneumonia.  Per chart note review patient finished  the  antibiotics but continued with increased shortness of breath and lower extremity edema. In the ED chest x-ray showed pleural effusions.  CT angio of the chest was negative for PE but did show bilateral large pleural effusions.  Palliative medicine was consulted for goals of care and met with the patient and her family. They have chosen to discharge home with the support of hospice services.  Writer met in the family room with patient's daughter Bevely Palmer to initiate education regarding hospice services, philosophy and team approach to care with good understanding voiced. DME needs include oxygen, BSC and nebulizer machine. Hospice information and contact numbers given to [facility]. Patient information faxed to referral. Hospital team updated. Plan is for discharge home tomorrow after DME is in place. Signed DNR in place in patient's chart. Patient will require EMS transport. Will continue to follow through discharge. Flo Shanks RN, BSN, [place] [facility] [place], hospital Liaison [phone]

## 2018-08-23 NOTE — Progress Notes (Signed)
Daily Progress Note   Patient Name: Joann Wilkinson       Date: 08/23/2018 DOB: 07-Sep-1922  Age: 84 y.o. MRN#: [id number] Attending Physician: Vaughan Basta, * Primary Care Physician: Marinda Elk, MD Admit Date: 08/20/2018  Reason for Consultation/Follow-up: Establishing goals of care  Subjective: Patient is lying in bed awake.  She denies pain or shortness of breath.  Continues to remain on 2 L/[place].  Patient is A&O x3 with some forgettfullness.  Private caregiver at the bedside along with patient's daughter Joann Wilkinson.  Patient's caregiver reports [name] ate for breakfast on this morning with some assistance and prompting.  She also had several instances of urinary incontinence.  I briefly reviewed our goals of care discussions on yesterday with Joann Wilkinson, patient's daughter/POA.  Joann Wilkinson verbalized that her and her sisters did not have any further questions or concerns.  She confirmed that their goals continue to be for their mother to be discharged back home with her hired 24/7 caregivers and hospice support.  Joann Wilkinson states that home equipment is being arranged for delivery on today and she is hopeful that her mother will be able to be discharged home on tomorrow.  Daughter confirms to continue with current treatment without aggressive measures or procedures.  She is just hopeful the mother will return home and receive the best care possible with support from hospice, with the expectation of not having to return to the hospital for further care.  Daughter states family is aware patients condition and the goal is to keep her in the home and comfortable until she passes away.  Chart Reviewed.   Length of Stay: 3  Current Medications: Scheduled Meds:  . amLODipine  5 mg Oral Daily  .  aspirin EC  81 mg Oral Daily  . enoxaparin (LOVENOX) injection  30 mg Subcutaneous Q24H  . fluticasone  1 spray Each Nare QHS  . insulin aspart  0-5 Units Subcutaneous QHS  . insulin aspart  0-9 Units Subcutaneous TID WC  . ipratropium-albuterol  3 mL Nebulization BID  . methylPREDNISolone (SOLU-MEDROL) injection  40 mg Intravenous Daily  . metoprolol tartrate  37.5 mg Oral BID  . mometasone-formoterol  2 puff Inhalation BID  . sodium chloride flush  3 mL Intravenous Q12H    Continuous Infusions: . sodium chloride  PRN Meds: sodium chloride, acetaminophen **OR** acetaminophen, albuterol, bisacodyl, guaiFENesin-dextromethorphan, Melatonin, metoprolol tartrate, ondansetron **OR** ondansetron (ZOFRAN) IV, pneumococcal 23 valent vaccine, senna-docusate, sodium chloride flush, traZODone  Physical Exam  Constitutional: She is oriented to person, place, and time. She is cooperative.  Cardiovascular: Normal rate, regular rhythm, normal heart sounds and normal pulses.  Trace bilateral lower extremity edema   Pulmonary/Chest: Effort normal. She has decreased breath sounds.  Neurological: She is alert and oriented to person, place, and time.  Nursing note and vitals reviewed.           Vital Wilkinson: BP (!) 145/65 (BP Location: Left Arm)   Pulse 64   Temp (!) 97.5 F (36.4 C) (Oral)   Resp 18   Ht 5\' 3"  (1.6 m)   Wt 66.9 kg   SpO2 97%   BMI 26.11 kg/m  SpO2: SpO2: 97 % O2 Device: O2 Device: Nasal Cannula O2 Flow Rate: O2 Flow Rate (L/min): 2 L/min  Intake/output summary:   Intake/Output Summary (Last 24 hours) at 08/23/2018 1017 Last data filed at 08/23/2018 6160 Gross per 24 hour  Intake 240 ml  Output 1000 ml  Net -760 ml   LBM: Last BM Date: 08/18/18(caretaker sd Sat - they give her milk of mag) Baseline Weight: Weight: 70.1 kg Most recent weight: Weight: 66.9 kg      Palliative Assessment/Data:PPS 30%   Patient Active Problem List   Diagnosis Date Noted  . Acute  heart failure with preserved ejection fraction ([place])   . Elevated troponin   . AKI (acute kidney injury) ([place])   . Pressure injury of skin 08/21/2018  . Chronic obstructive pulmonary disease ([place]) 08/20/2018  . Encephalopathy 09/23/2015  . Urinary tract infection 09/23/2015  . Asthma without status asthmaticus 07/28/2014  . Congestive heart failure ([place]) 07/28/2014  . CAD in native artery 07/28/2014  . Type 2 diabetes mellitus ([place]) 07/28/2014  . HLD (hyperlipidemia) 07/28/2014  . BP (high blood pressure) 07/28/2014  . Cancer of lung ([place]) 07/28/2014  . Malignant neoplasm of oral cavity ([place]) 07/28/2014  . Arthritis, degenerative 07/28/2014  . 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 Bladder infection, chronic 01/16/2013  . Mixed incontinence 01/16/2013  . Excessive urination at night 01/16/2013  . FOM (frequency of micturition) 01/16/2013   Palliative Care Assessment & Plan   Patient Profile: 84 y.o. female admitted on 08/20/2018 from home with complaints of worsening shortness of breath.  She has a past medical history significant for osteoarthritis, oral cancer, lung cancer, shingles, hypertension, diabetes, COPD, CHF, asthma, anemia, TIAs, and retinal artery occlusion.  Patient presented to the emergency department after a 2-week long course of antibiotic treatments for pneumonia.  Patient and family reported that she had finished antibiotics however her shortness of breath seem to have worsened over several days prior to admission.  Patient also endorses bilateral leg swelling for the past 2 weeks.  Her ED course chest x-ray showed pleural effusions.  CT angios of the chest was negative for PE but did show bilateral large pleural effusions.  Sodium 133.  WBC 5.9.  Troponin 0.03.  His admission patient continues on IV Solu-Medrol, CHF protocol, and receiving electrolyte replacement.  She is being followed by cardiology.  Palliative medicine team consulted for goals of care  discussion.  Recommendations/Plan:  DNR/DNI  Continue with current care without escalation of care/procedures   D/C home with outpatient hospice services at family's request with goal of being kept comfortable in the home, and eliminate  hospital readmissions.   Goals of Care and Additional Recommendations:  Limitations on Scope of Treatment: Full Scope Treatment, No Artificial Feeding and No Surgical Procedures  Code Status:    Code Status Orders  (From admission, onward)         Start     Ordered   08/20/18 1548  Do not attempt resuscitation (DNR)  Continuous    Question Answer Comment  In the event of cardiac or respiratory ARREST Do not call a "code blue"   In the event of cardiac or respiratory ARREST Do not perform Intubation, CPR, defibrillation or ACLS   In the event of cardiac or respiratory ARREST Use medication by any route, position, wound care, and other measures to relive pain and suffering. May use oxygen, suction and manual treatment of airway obstruction as needed for comfort.      08/20/18 1547        Code Status History    Date Active Date Inactive Code Status Order ID Comments User Context   09/23/2015 2310 09/26/2015 1537 Full Code [id number]  Hower, Aaron Mose, MD ED    Advance Directive Documentation     Most Recent Value  Type of Advance Directive  Living will  Pre-existing out of facility DNR order (yellow form or pink MOST form)  -  "MOST" Form in Place?  -     Prognosis:   Unable to determine poor in the setting of osteoarthritis, lung cancer, diabetes, COPD, CHF, pneumonia, bilateral pleural effusions, coronary artery disease, asthma, TIAs, decreased mobility, decreased p.o. intake.  Discharge Planning:  Home with Hospice  Care plan was discussed with patient's family, Dr. Anselm Jungling, and Hassan Rowan, [place].   Thank you for allowing the Palliative Medicine Team to assist in the care of this patient.   Total Time 35 min.  Prolonged Time Billed  NO        Greater than 50%  of this time was spent counseling and coordinating care related to the above assessment and plan.  Alda Lea, AGPCNP-BC Palliative Medicine Team  Pager: [phone] Amion: Bjorn Pippin   Please contact Palliative Medicine Team phone at [phone] for questions and concerns.

## 2018-08-23 NOTE — Progress Notes (Signed)
[facility] [place] NAME: Joann Wilkinson    MR#:  [id number]  DATE OF BIRTH:  1922/06/03  SUBJECTIVE:  CHIEF COMPLAINT:   Chief Complaint  Patient presents with  . Respiratory Distress    Last few weeks had been treated for pneumonia but continued to get worse and have some chest pain with hypoxia so brought to the emergency room.  Noted to have large bilateral pleural effusion. Patient feelssame today compared to yesterday. Still on oxygen.  REVIEW OF SYSTEMS:  CONSTITUTIONAL: No fever, have fatigue or weakness.  EYES: No blurred or double vision.  EARS, NOSE, AND THROAT: No tinnitus or ear pain.  RESPIRATORY: No cough, have shortness of breath, wheezing or hemoptysis.  CARDIOVASCULAR: No chest pain, orthopnea, edema.  GASTROINTESTINAL: No nausea, vomiting, diarrhea or abdominal pain.  GENITOURINARY: No dysuria, hematuria.  ENDOCRINE: No polyuria, nocturia,  HEMATOLOGY: No anemia, easy bruising or bleeding SKIN: No rash or lesion. MUSCULOSKELETAL: No joint pain or arthritis.   NEUROLOGIC: No tingling, numbness, weakness.  PSYCHIATRY: No anxiety or depression.   ROS  DRUG ALLERGIES:   Allergies  Allergen Reactions  . Codeine Nausea Only  . Darvon [Propoxyphene] Nausea Only  . Etodolac Nausea Only  . Lisinopril Cough  . Morphine And Related Nausea Only  . Nortriptyline Nausea Only  . Tramadol Nausea Only    VITALS:  Blood pressure (!) 145/65, pulse 64, temperature (!) 97.5 F (36.4 C), temperature source Oral, resp. rate 18, height 5\' 3"  (1.6 m), weight 66.9 kg, SpO2 97 %.  PHYSICAL EXAMINATION:  GENERAL:  84 y.o.-year-old patient lying in the bed with no acute distress.  EYES: Pupils equal, round, reactive to light and accommodation. No scleral icterus. Extraocular muscles intact.  HEENT: Head atraumatic, normocephalic. Oropharynx and nasopharynx clear.  Hard of hearing. NECK:  Supple, no jugular venous distention. No  thyroid enlargement, no tenderness.  LUNGS: Decreased breath sounds bilaterally, no wheezing, have crepitation. No use of accessory muscles of respiration.  On supplemental oxygen. CARDIOVASCULAR: S1, S2 normal. No murmurs, rubs, or gallops.  ABDOMEN: Soft, nontender, nondistended. Bowel sounds present. No organomegaly or mass.  EXTREMITIES: No pedal edema, cyanosis, or clubbing.  NEUROLOGIC: Cranial nerves II through XII are intact. Muscle strength 3/5 in all extremities. Sensation intact. Gait not checked.  PSYCHIATRIC: The patient is alert and oriented x 2.  SKIN: No obvious rash, lesion, or ulcer.   Physical Exam LABORATORY PANEL:   CBC Recent Labs  Lab 08/23/18 0419  WBC 7.6  HGB 10.8*  HCT 31.4*  PLT 214   ------------------------------------------------------------------------------------------------------------------  Chemistries  Recent Labs  Lab 08/20/18 1213  08/23/18 0419  NA 133*   < > 132*  K 4.4   < > 4.4  CL 99   < > 98  CO2 24   < > 26  GLUCOSE 166*   < > 212*  BUN 19   < > 50*  CREATININE 1.00   < > 1.50*  CALCIUM 8.6*   < > 7.8*  MG  --    < > 2.1  AST 22  --   --   ALT 9  --   --   ALKPHOS 53  --   --   BILITOT 0.7  --   --    < > = values in this interval not displayed.   ------------------------------------------------------------------------------------------------------------------  Cardiac Enzymes Recent Labs  Lab 08/20/18 1213 08/21/18 1320  TROPONINI 0.03* 0.10*   ------------------------------------------------------------------------------------------------------------------  RADIOLOGY:  Dg Chest 2 View  Result Date: 08/22/2018 CLINICAL DATA:  Hypoxia EXAM: CHEST - 2 VIEW COMPARISON:  08/20/2018 FINDINGS: Cardiac shadow is stable. Bilateral pleural effusions are again noted right greater than left. The overall appearance is stable. Some loculated fluid is noted within the fissure on the right also stable from the prior exam. No  focal confluent infiltrate is seen. IMPRESSION: Bilateral pleural effusions stable from the previous exam. Electronically Signed   By: Inez Catalina M.D.   On: 08/22/2018 11:30    ASSESSMENT AND PLAN:   Active Problems:   Chronic obstructive pulmonary disease (HCC)   Pressure injury of skin   Acute heart failure with preserved ejection fraction (HCC)   Elevated troponin   AKI (acute kidney injury) ([place])  * Acute respiratory failure with hypoxia due to Large bilateral pleural effusions and COPD exacerbation. Continue oxygen by nasal cannula, IV Solu-Medrol-decrease frequency , albuterol every 6 hours, Robitussin as needed.  Pleural tap is Denied by family during discussion with palliative care and also with me.  She will need Oxygen at home, family had agreed on hospice arrangements.  *  acute on chronic systolic CHF.  Start CHF protocol, Lasix twice daily and echocardiograph.  Cardiology consult appreciated, EF 45% Stopped lasix slight worsening in the renal function.  * Bacteremia 1 blood cultures positive for staph species It could be contamination as patient clinically appears fine. Discussed with ID physician on the phone and reviewed the results as it looks like contamination, will not do ID consult and stop antibiotics.  * Hyponatremia.  Follow-up sodium level while on Lasix.  * Diabetes.  Start sliding scale.  * Hypertension.  Continue home hypertension medication.  After meeting with palliative care, family agreed on hospice services, They are making equipment arrangements- discharge tomorrow.  All the records are reviewed and case discussed with Care Management/Social Workerr. Management plans discussed with the patient, family and they are in agreement.  CODE STATUS: DNR  TOTAL TIME TAKING CARE OF THIS PATIENT: 35 minutes.    POSSIBLE D/C IN 1-2 DAYS, DEPENDING ON CLINICAL CONDITION.   Vaughan Basta M.D on 08/23/2018   Between 7am to 6pm - Pager -  [phone]  After 6pm go to [email or web address] - password [email or web address] [facility]  Office  [phone]  CC: Primary care physician; Marinda Elk, MD  Note: This dictation was prepared with Dragon dictation along with smaller phrase technology. Any transcriptional errors that result from this process are unintentional.

## 2018-08-23 NOTE — Plan of Care (Signed)

## 2018-08-23 NOTE — Care Management Important Message (Signed)
Copy of signed IM left with patient in room.  

## 2018-08-24 LAB — BASIC METABOLIC PANEL
Anion gap: 9 (ref 5–15)
BUN: 59 mg/dL — ABNORMAL HIGH (ref 8–23)
CALCIUM: 7.9 mg/dL — AB (ref 8.9–10.3)
CO2: 24 mmol/L (ref 22–32)
CREATININE: 1.56 mg/dL — AB (ref 0.44–1.00)
Chloride: 98 mmol/L (ref 98–111)
GFR calc non Af Amer: 27 mL/min — ABNORMAL LOW (ref >60)
GFR, EST AFRICAN AMERICAN: 32 mL/min — AB (ref >60)
Glucose, Bld: 226 mg/dL — ABNORMAL HIGH (ref 70–99)
Potassium: 4.6 mmol/L (ref 3.5–5.1)
SODIUM: 131 mmol/L — AB (ref 135–145)

## 2018-08-24 LAB — CULTURE, BLOOD (ROUTINE X 2)

## 2018-08-24 LAB — GLUCOSE, CAPILLARY
GLUCOSE-CAPILLARY: 212 mg/dL — AB (ref 70–99)
Glucose-Capillary: 191 mg/dL — ABNORMAL HIGH (ref 70–99)
Glucose-Capillary: 283 mg/dL — ABNORMAL HIGH (ref 70–99)

## 2018-08-24 MED ORDER — ALBUTEROL SULFATE (2.5 MG/3ML) 0.083% IN NEBU: 2.5000 mg | INHALATION_SOLUTION | RESPIRATORY_TRACT | 12 refills | Status: AC | PRN

## 2018-08-24 MED ORDER — GUAIFENESIN-DM 100-10 MG/5ML PO SYRP: 5.0000 mL | ORAL_SOLUTION | Freq: Four times a day (QID) | ORAL | 0 refills | Status: AC | PRN

## 2018-08-24 MED ORDER — METOPROLOL TARTRATE 37.5 MG PO TABS: 37.5000 mg | ORAL_TABLET | Freq: Two times a day (BID) | ORAL | 0 refills | Status: AC

## 2018-08-24 MED ORDER — BISACODYL 5 MG PO TBEC: 5.0000 mg | DELAYED_RELEASE_TABLET | Freq: Every day | ORAL | 0 refills | Status: AC | PRN

## 2018-08-24 NOTE — Discharge Summary (Signed)
Joann Wilkinson NAME: Joann Wilkinson    MR#:  [id number]  DATE OF BIRTH:  08-03-1922  DATE OF ADMISSION:  08/20/2018 ADMITTING PHYSICIAN: Demetrios Loll, MD  DATE OF DISCHARGE: 08/24/2018   PRIMARY CARE PHYSICIAN: Marinda Elk, MD    ADMISSION DIAGNOSIS:  Pleural effusion [J90] COPD exacerbation (HCC) [J44.1] Acute respiratory failure with hypoxia (HCC) [J96.01]  DISCHARGE DIAGNOSIS:  Active Problems:   Chronic obstructive pulmonary disease (HCC)   Pressure injury of skin   Acute heart failure with preserved ejection fraction (HCC)   Elevated troponin   AKI (acute kidney injury) ([place])   SECONDARY DIAGNOSIS:   Past Medical History:  Diagnosis Date  . Anemia   . Asthma   . CAD (coronary artery disease)   . Carpal tunnel syndrome   . CHF (congestive heart failure) ([place])   . COPD (chronic obstructive pulmonary disease) ([place])   . Diabetes mellitus without complication ([place])   . Essential hypertension   . History of shingles   . Lung cancer ([place])   . Oral cancer ([place])   . Osteoarthritis   . Retinal artery occlusion   . Stroke ([place])   . TIA (transient ischemic attack)     HOSPITAL COURSE:   * Acute respiratory failure with hypoxia due toLarge bilateral pleural effusionsand COPD exacerbation. Continue oxygen by nasal cannula, IV Solu-Medrol-decrease frequency , albuterol every 6 hours, Robitussin as needed.  Pleural tap is Denied by family during discussion with palliative care and also with me.  She will need Oxygen at home, family had agreed on hospice arrangements.  *  acute on chronic systolic CHF.  Start CHF protocol, Lasix twice daily and echocardiograph. Cardiology consult appreciated, EF 45% Stopped lasix slight worsening in the renal function.  * Bacteremia 1 blood cultures positive for staph species It could be contamination as patient clinically appears fine. Discussed with ID physician on  the phone and reviewed the results as it looks like contamination, will not do ID consult and stop antibiotics.  * Hyponatremia. Follow-up sodium level whileon Lasix.  * Diabetes. Start sliding scale.  * Hypertension. Continue home hypertension medication.  * Ac renal failure- Likely combination of lasix and IV dye for CT scan, Stabilize for now.  To be discharged today with hospice at home.  DISCHARGE CONDITIONS:  Stable.  CONSULTS OBTAINED:    DRUG ALLERGIES:   Allergies  Allergen Reactions  . Codeine Nausea Only  . Darvon [Propoxyphene] Nausea Only  . Etodolac Nausea Only  . Lisinopril Cough  . Morphine And Related Nausea Only  . Nortriptyline Nausea Only  . Tramadol Nausea Only    DISCHARGE MEDICATIONS:   Allergies as of 08/24/2018      Reactions   Codeine Nausea Only   Darvon [propoxyphene] Nausea Only   Etodolac Nausea Only   Lisinopril Cough   Morphine And Related Nausea Only   Nortriptyline Nausea Only   Tramadol Nausea Only      Medication List    STOP taking these medications   albuterol 108 (90 Base) MCG/ACT inhaler Commonly known as:  PROVENTIL HFA;VENTOLIN HFA Replaced by:  albuterol (2.5 MG/3ML) 0.083% nebulizer solution   amLODipine 5 MG tablet Commonly known as:  NORVASC   CALCIUM-MAGNESIUM-VITAMIN D PO   conjugated estrogens vaginal cream Commonly known as:  PREMARIN   [employer] CRANBERRY 500 MG Caps Generic drug:  Cranberry     TAKE these medications   albuterol (2.5 MG/3ML)  0.083% nebulizer solution Commonly known as:  PROVENTIL Take 3 mLs (2.5 mg total) by nebulization every 2 (two) hours as needed for wheezing. Replaces:  albuterol 108 (90 Base) MCG/ACT inhaler   aspirin EC 81 MG tablet Take 81 mg by mouth daily.   bisacodyl 5 MG EC tablet Commonly known as:  DULCOLAX Take 1 tablet (5 mg total) by mouth daily as needed for moderate constipation.   fluticasone 50 MCG/ACT nasal spray Commonly known as:  FLONASE Place 1  spray into both nostrils at bedtime.   Fluticasone-Salmeterol 250-50 MCG/DOSE Aepb Commonly known as:  ADVAIR Inhale 1 puff into the lungs 2 (two) times daily.   guaiFENesin-dextromethorphan 100-10 MG/5ML syrup Commonly known as:  ROBITUSSIN DM Take 5 mLs by mouth every 6 (six) hours as needed for cough.   Metoprolol Tartrate 37.5 MG Tabs Take 37.5 mg by mouth 2 (two) times daily.        DISCHARGE INSTRUCTIONS:    Hospice at home.  If you experience worsening of your admission symptoms, develop shortness of breath, life threatening emergency, suicidal or homicidal thoughts you must seek medical attention immediately by calling 911 or calling your MD immediately  if symptoms less severe.  You Must read complete instructions/literature along with all the possible adverse reactions/side effects for all the Medicines you take and that have been prescribed to you. Take any new Medicines after you have completely understood and accept all the possible adverse reactions/side effects.   Please note  You were cared for by a hospitalist during your hospital stay. If you have any questions about your discharge medications or the care you received while you were in the hospital after you are discharged, you can call the unit and asked to speak with the hospitalist on call if the hospitalist that took care of you is not available. Once you are discharged, your primary care physician will handle any further medical issues. Please note that NO REFILLS for any discharge medications will be authorized once you are discharged, as it is imperative that you return to your primary care physician (or establish a relationship with a primary care physician if you do not have one) for your aftercare needs so that they can reassess your need for medications and monitor your lab values.    Today   CHIEF COMPLAINT:   Chief Complaint  Patient presents with  . Respiratory Distress    HISTORY OF PRESENT  ILLNESS:  Joann Wilkinson  is a 84 y.o. female with a known history of COPD, CHF, CAD, hypertension, hyperlipidemia, diabetes, stroke, lung cancer and pneumonia.  The patient has been treated with antibiotics for pneumonia for the past 2 weeks.  He finished his antibiotics but still has worsening shortness of breath for the past several days.  In addition, the patient has had leg swelling for the past 2 weeks.  The patient denies any fever or chills, no chest pain, orthopnea or nocturnal dyspnea.  Chest x-ray show pleural effusion.  CT angiogram of the chest to did not show any PE but bilateral large pleural effusion.  VITAL SIGNS:  Blood pressure (!) 148/61, pulse 76, temperature 97.6 F (36.4 C), temperature source Oral, resp. rate 16, height 5\' 3"  (1.6 m), weight 67.7 kg, SpO2 98 %.  I/O:    Intake/Output Summary (Last 24 hours) at 08/24/2018 1414 Last data filed at 08/23/2018 1920 Gross per 24 hour  Intake -  Output 750 ml  Net -750 ml    PHYSICAL EXAMINATION:  GENERAL:  84 y.o.-year-old patient lying in the bed with no acute distress.  EYES: Pupils equal, round, reactive to light and accommodation. No scleral icterus. Extraocular muscles intact.  HEENT: Head atraumatic, normocephalic. Oropharynx and nasopharynx clear.  Hard of hearing. NECK:  Supple, no jugular venous distention. No thyroid enlargement, no tenderness.  LUNGS: Decreased breath sounds bilaterally, no wheezing, have crepitation. No use of accessory muscles of respiration.  On supplemental oxygen. CARDIOVASCULAR: S1, S2 normal. No murmurs, rubs, or gallops.  ABDOMEN: Soft, nontender, nondistended. Bowel sounds present. No organomegaly or mass.  EXTREMITIES: No pedal edema, cyanosis, or clubbing.  NEUROLOGIC: Cranial nerves II through XII are intact. Muscle strength 3/5 in all extremities. Sensation intact. Gait not checked.  PSYCHIATRIC: The patient is alert and oriented x 2.  SKIN: No obvious rash, lesion, or ulcer.    DATA REVIEW:   CBC Recent Labs  Lab 08/23/18 0419  WBC 7.6  HGB 10.8*  HCT 31.4*  PLT 214    Chemistries  Recent Labs  Lab 08/20/18 1213  08/23/18 0419 08/24/18 0429  NA 133*   < > 132* 131*  K 4.4   < > 4.4 4.6  CL 99   < > 98 98  CO2 24   < > 26 24  GLUCOSE 166*   < > 212* 226*  BUN 19   < > 50* 59*  CREATININE 1.00   < > 1.50* 1.56*  CALCIUM 8.6*   < > 7.8* 7.9*  MG  --    < > 2.1  --   AST 22  --   --   --   ALT 9  --   --   --   ALKPHOS 53  --   --   --   BILITOT 0.7  --   --   --    < > = values in this interval not displayed.    Cardiac Enzymes Recent Labs  Lab 08/21/18 1320  TROPONINI 0.10*    Microbiology Results  Results for orders placed or performed during the hospital encounter of 08/20/18  Blood Culture (routine x 2)     Status: Abnormal   Collection Time: 08/20/18 12:13 PM  Result Value Ref Range Status   Specimen Description   Final    BLOOD LEFT FA Performed at [facility], [address], [place], [place] [address]    Special Requests   Final    BOTTLES DRAWN AEROBIC AND ANAEROBIC Blood Culture results may not be optimal due to an excessive volume of blood received in culture bottles Performed at [facility], [address] [place], [place] [address]    Culture  Setup Time   Final    [place] TO, READ BACK BY AND VERIFIED WITH: C/MATT MCBANE @0220  08/21/18 [facility] AEROBIC BOTTLE ONLY GRAM POSITIVE RODS    Culture (A)  Final    STAPHYLOCOCCUS SPECIES (COAGULASE NEGATIVE) THE SIGNIFICANCE OF ISOLATING THIS ORGANISM FROM A SINGLE SET OF BLOOD CULTURES WHEN MULTIPLE SETS ARE DRAWN IS UNCERTAIN. PLEASE NOTIFY THE MICROBIOLOGY DEPARTMENT WITHIN ONE WEEK IF SPECIATION AND  SENSITIVITIES ARE REQUIRED. DIPHTHEROIDS(CORYNEBACTERIUM SPECIES) Standardized susceptibility testing for this organism is not available. Performed at [facility], [place] [address],  [place], [place] [address]    Report Status 08/24/2018 FINAL  Final  Blood Culture (routine x 2)     Status: None (Preliminary result)   Collection Time: 08/20/18 12:13 PM  Result Value Ref Range Status   Specimen Description BLOOD RIGHT FA  Final   Special Requests   Final    BOTTLES DRAWN AEROBIC AND ANAEROBIC Blood Culture adequate volume   Culture   Final    NO GROWTH 4 DAYS Performed at [facility], [address], [place], [place] [address]    Report Status PENDING  Incomplete  Urine culture     Status: None   Collection Time: 08/20/18 12:13 PM  Result Value Ref Range Status   Specimen Description   Final    URINE, RANDOM Performed at [facility], [address] [place], [place] [address]    Special Requests   Final    NONE Performed at [facility], [address] [place], [place] [address]    Culture   Final    NO GROWTH Performed at [facility], [place] [address], [place], [place] [address]    Report Status 08/21/2018 FINAL  Final  Blood Culture ID Panel (Reflexed)     Status: Abnormal   Collection Time: 08/20/18 12:13 PM  Result Value Ref Range Status   Enterococcus species NOT DETECTED NOT DETECTED Final   Listeria monocytogenes NOT DETECTED NOT DETECTED Final   Staphylococcus species DETECTED (A) NOT DETECTED Final    Comment: Methicillin (oxacillin) susceptible coagulase negative staphylococcus. Possible blood culture contaminant (unless isolated from more than one blood culture draw or clinical case suggests pathogenicity). No antibiotic treatment is indicated for blood  culture contaminants. CRITICAL RESULT CALLED TO, READ BACK BY AND VERIFIED WITH: C/MATT MCBANE @0220  08/21/18 [facility]    Staphylococcus aureus NOT DETECTED NOT DETECTED Final   Methicillin resistance NOT DETECTED NOT DETECTED Final   Streptococcus species NOT DETECTED NOT DETECTED Final   Streptococcus agalactiae NOT DETECTED NOT DETECTED Final   Streptococcus  pneumoniae NOT DETECTED NOT DETECTED Final   Streptococcus pyogenes NOT DETECTED NOT DETECTED Final   Acinetobacter baumannii NOT DETECTED NOT DETECTED Final   Enterobacteriaceae species NOT DETECTED NOT DETECTED Final   Enterobacter cloacae complex NOT DETECTED NOT DETECTED Final   Escherichia coli NOT DETECTED NOT DETECTED Final   Klebsiella oxytoca NOT DETECTED NOT DETECTED Final   Klebsiella pneumoniae NOT DETECTED NOT DETECTED Final   Proteus species NOT DETECTED NOT DETECTED Final   Serratia marcescens NOT DETECTED NOT DETECTED Final   Haemophilus influenzae NOT DETECTED NOT DETECTED Final   Neisseria meningitidis NOT DETECTED NOT DETECTED Final   Pseudomonas aeruginosa NOT DETECTED NOT DETECTED Final   Candida albicans NOT DETECTED NOT DETECTED Final   Candida glabrata NOT DETECTED NOT DETECTED Final   Candida krusei NOT DETECTED NOT DETECTED Final   Candida parapsilosis NOT DETECTED NOT DETECTED Final   Candida tropicalis NOT DETECTED NOT DETECTED Final    Comment: Performed at [facility], [address] [place], [place] [address]  MRSA PCR Screening     Status: None   Collection Time: 08/21/18  2:31 PM  Result Value Ref Range Status   MRSA by PCR NEGATIVE NEGATIVE Final    Comment:        The GeneXpert MRSA Assay (FDA approved for NASAL specimens only), is one component of a comprehensive MRSA colonization surveillance program. It is not intended to diagnose MRSA infection nor to guide or monitor treatment for MRSA infections. Performed at [facility], [address] [place], [place] [address]     RADIOLOGY:  No results found.  EKG:   Orders placed or  performed during the hospital encounter of 08/20/18  . EKG 12-Lead  . EKG 12-Lead  . EKG 12-Lead  . EKG 12-Lead      Management plans discussed with the patient, family and they are in agreement.  CODE STATUS:     Code Status Orders  (From admission, onward)         Start      Ordered   08/20/18 1548  Do not attempt resuscitation (DNR)  Continuous    Question Answer Comment  In the event of cardiac or respiratory ARREST Do not call a "code blue"   In the event of cardiac or respiratory ARREST Do not perform Intubation, CPR, defibrillation or ACLS   In the event of cardiac or respiratory ARREST Use medication by any route, position, wound care, and other measures to relive pain and suffering. May use oxygen, suction and manual treatment of airway obstruction as needed for comfort.      08/20/18 1547        Code Status History    Date Active Date Inactive Code Status Order ID Comments User Context   09/23/2015 2310 09/26/2015 1537 Full Code [id number]  Hower, Aaron Mose, MD ED    Advance Directive Documentation     Most Recent Value  Type of Advance Directive  Living will  Pre-existing out of facility DNR order (yellow form or pink MOST form)  -  "MOST" Form in Place?  -      TOTAL TIME TAKING CARE OF THIS PATIENT: 35 minutes.    Vaughan Basta M.D on 08/24/2018 at 2:14 PM  Between 7am to 6pm - Pager - [phone]  After 6pm go to [email or web address] - password [email or web address] [facility]  Office  [phone]  CC: Primary care physician; Marinda Elk, MD   Note: This dictation was prepared with Dragon dictation along with smaller phrase technology. Any transcriptional errors that result from this process are unintentional.

## 2018-08-24 NOTE — Progress Notes (Signed)
Follow up visit made to new referral for [facility] at home. Patient seen lying in bed, son and hired Cabin crew present. Patient alert, hard of hearing, but interactive, no dyspnea noted. She remains on 2 liters of oxygen. Education provided to hired care giver regarding elevation of patient's legs when out of bed. She voiced understanding. Plan is for discharge home today after DME has been delivered. Patient will require EMS transport. Hospital care team updated.  Flo Shanks RN, BSN, [place] [facility], hospital Liaison [phone]

## 2018-08-24 NOTE — Discharge Instructions (Signed)
Follow by hospice after discharge at home.

## 2018-08-24 NOTE — Progress Notes (Signed)
Pt being discharged home with hospice, discharge instructions and prescriptions reviewed with pt and caregiver sharon, pt with no complaints

## 2018-08-24 NOTE — Care Management (Signed)
Discharge to home today per Dr. Anselm Jungling. Will be transported per [facility]. Will be followed by [facility] RN MSN [place] [facility] [phone]

## 2018-08-25 LAB — CULTURE, BLOOD (ROUTINE X 2)
CULTURE: NO GROWTH
SPECIAL REQUESTS: ADEQUATE

## 2019-02-03 DEATH — deceased
